# Patient Record
Sex: Male | Born: 1975 | Race: White | Hispanic: No | Smoking: Never smoker
Health system: Southern US, Community
[De-identification: ages and names within clinical notes are randomized; demographics above are authoritative.]

## PROBLEM LIST (undated history)

## (undated) ENCOUNTER — Emergency Department: Admission: EM | Payer: BC Managed Care – PPO

## (undated) DIAGNOSIS — J45909 Unspecified asthma, uncomplicated: Secondary | ICD-10-CM

## (undated) HISTORY — PX: KNEE ARTHROSCOPY W/ MENISCAL REPAIR: SHX1877

---

## 2007-05-12 ENCOUNTER — Ambulatory Visit: Payer: Self-pay | Admitting: Family Medicine

## 2007-06-03 ENCOUNTER — Emergency Department: Payer: Self-pay | Admitting: Unknown Physician Specialty

## 2008-07-15 ENCOUNTER — Ambulatory Visit: Payer: Self-pay | Admitting: Family Medicine

## 2011-02-08 ENCOUNTER — Ambulatory Visit: Payer: Self-pay | Admitting: Family Medicine

## 2012-11-06 ENCOUNTER — Ambulatory Visit: Payer: Self-pay | Admitting: Sports Medicine

## 2014-04-01 ENCOUNTER — Ambulatory Visit: Payer: Self-pay | Admitting: Family Medicine

## 2014-07-12 ENCOUNTER — Ambulatory Visit: Payer: Self-pay | Admitting: Unknown Physician Specialty

## 2014-07-23 DIAGNOSIS — S83242A Other tear of medial meniscus, current injury, left knee, initial encounter: Secondary | ICD-10-CM | POA: Insufficient documentation

## 2014-08-07 ENCOUNTER — Ambulatory Visit: Payer: Self-pay | Admitting: Unknown Physician Specialty

## 2014-09-02 ENCOUNTER — Ambulatory Visit: Payer: Self-pay

## 2014-09-02 LAB — CBC WITH DIFFERENTIAL/PLATELET
Basophil #: 0.1 10*3/uL (ref 0.0–0.1)
Basophil %: 1 %
EOS ABS: 0.1 10*3/uL (ref 0.0–0.7)
EOS PCT: 2.5 %
HCT: 44.5 % (ref 40.0–52.0)
HGB: 15.1 g/dL (ref 13.0–18.0)
Lymphocyte #: 2.1 10*3/uL (ref 1.0–3.6)
Lymphocyte %: 39.2 %
MCH: 28.8 pg (ref 26.0–34.0)
MCHC: 33.9 g/dL (ref 32.0–36.0)
MCV: 85 fL (ref 80–100)
MONO ABS: 0.5 x10 3/mm (ref 0.2–1.0)
Monocyte %: 8.8 %
Neutrophil #: 2.7 10*3/uL (ref 1.4–6.5)
Neutrophil %: 48.5 %
Platelet: 209 10*3/uL (ref 150–440)
RBC: 5.23 10*6/uL (ref 4.40–5.90)
RDW: 13.4 % (ref 11.5–14.5)
WBC: 5.5 10*3/uL (ref 3.8–10.6)

## 2014-09-02 LAB — CK: CK, TOTAL: 85 U/L (ref 39–308)

## 2014-09-02 LAB — COMPREHENSIVE METABOLIC PANEL
ALT: 34 U/L
ANION GAP: 9 (ref 7–16)
Albumin: 4.1 g/dL (ref 3.4–5.0)
Alkaline Phosphatase: 57 U/L
BUN: 13 mg/dL (ref 7–18)
Bilirubin,Total: 0.5 mg/dL (ref 0.2–1.0)
CHLORIDE: 102 mmol/L (ref 98–107)
Calcium, Total: 9.4 mg/dL (ref 8.5–10.1)
Co2: 30 mmol/L (ref 21–32)
Creatinine: 1.23 mg/dL (ref 0.60–1.30)
EGFR (African American): >60
Glucose: 97 mg/dL (ref 65–99)
OSMOLALITY: 281 (ref 275–301)
POTASSIUM: 4.7 mmol/L (ref 3.5–5.1)
SGOT(AST): 18 U/L (ref 15–37)
Sodium: 141 mmol/L (ref 136–145)
Total Protein: 7.3 g/dL (ref 6.4–8.2)

## 2014-09-02 LAB — HEMOGLOBIN A1C: Hemoglobin A1C: 5.7 % (ref 4.2–6.3)

## 2014-09-02 LAB — CK-MB: CK-MB: 0.8 ng/mL (ref 0.5–3.6)

## 2015-08-26 ENCOUNTER — Encounter: Payer: Self-pay | Admitting: Family Medicine

## 2015-08-26 ENCOUNTER — Ambulatory Visit (INDEPENDENT_AMBULATORY_CARE_PROVIDER_SITE_OTHER): Payer: BC Managed Care – PPO | Admitting: Family Medicine

## 2015-08-26 VITALS — BP 110/70 | HR 78 | Ht 67.0 in | Wt 190.0 lb

## 2015-08-26 DIAGNOSIS — G44219 Episodic tension-type headache, not intractable: Secondary | ICD-10-CM | POA: Diagnosis not present

## 2015-08-26 DIAGNOSIS — R55 Syncope and collapse: Secondary | ICD-10-CM | POA: Diagnosis not present

## 2015-08-26 NOTE — Progress Notes (Signed)
Name: Wesley Vaughn   MRN: [id number]    DOB: 10-08-1975   Date:08/26/2015       Progress Note  Subjective  Chief Complaint  Chief Complaint  Patient presents with  . Hypertension    last week had episodes of feeling like "I'm on the sideline in the wrestling matches, yelling, and a feeling of dizziness and lightheaded" Also having a dull headache that lasts approx a couple of minutes during the daytime. 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 The current episode started 1 to 4 weeks ago. The problem occurs intermittently. The problem has been unchanged. Associated symptoms include headaches, nausea and neck pain. Pertinent negatives include no abdominal pain, chest pain, chills, congestion, coughing, diaphoresis, fever, myalgias, numbness, rash, sore throat, swollen glands, vertigo, visual change or weakness. The symptoms are aggravated by exertion (excitment/vocalization). He has tried nothing for the symptoms. The treatment provided no relief.  Headache  This is a recurrent problem. The current episode started 1 to 4 weeks ago. The problem occurs intermittently. The problem has been waxing and waning. The pain is located in the left unilateral and frontal region. The pain does not radiate. The quality of the pain is described as aching. The pain is at a severity of 3/10. The pain is moderate. Associated symptoms include dizziness, nausea and neck pain. Pertinent negatives include no abdominal pain, abnormal behavior, back pain, blurred vision, coughing, ear pain, eye watering, fever, hearing loss, insomnia, loss of balance, muscle aches, numbness, phonophobia, photophobia, sore throat, swollen glands, tingling, tinnitus, visual change, weakness or weight loss. The symptoms are aggravated by emotional stress. He has tried acetaminophen for the symptoms. The treatment provided mild relief. There is no history of  cluster headaches, hypertension or recent head traumas.    No problem-specific assessment & plan notes found for this encounter.   No past medical history on file.  Past Surgical History  Procedure Laterality Date  . Knee arthroscopy w/ meniscal repair Left     No family history on file.  Social History   Social History  . Marital Status: Married    Spouse Name: N/A  . Number of Children: N/A  . Years of Education: N/A   Occupational History  . Not on file.   Social History Main Topics  . Smoking status: Never Smoker   . Smokeless tobacco: Current User    Types: Snuff  . Alcohol Use: 0.0 oz/week    0 Standard drinks or equivalent per week  . Drug Use: No  . Sexual Activity: Not on file   Other Topics Concern  . Not on file   Social History Narrative  . No narrative on file    No Known Allergies   Review of Systems  Constitutional: Negative for fever, chills, weight loss, malaise/fatigue and diaphoresis.  HENT: Negative for congestion, ear discharge, ear pain, hearing loss, sore throat and tinnitus.   Eyes: Negative for blurred vision and photophobia.  Respiratory: Negative for cough, sputum production, shortness of breath and wheezing.   Cardiovascular: Negative for chest pain, palpitations and leg swelling.  Gastrointestinal: Positive for nausea. Negative for heartburn, abdominal pain, diarrhea, constipation, blood in stool and melena.  Genitourinary: Negative for dysuria, urgency, frequency and hematuria.  Musculoskeletal: Positive for neck pain. Negative for myalgias, back pain and joint pain.  Skin: Negative for rash.  Neurological: Positive for dizziness and headaches. Negative for  vertigo, tingling, sensory change, focal weakness, weakness, numbness and loss of balance.  Endo/Heme/Allergies: Negative for environmental allergies and polydipsia. Does not bruise/bleed easily.  Psychiatric/Behavioral: Negative for depression and suicidal ideas. The patient  is not nervous/anxious and does not have insomnia.      Objective  Filed Vitals:   08/26/15 1604  BP: 110/70  Pulse: 78  Height: 5' 7"  (1.702 m)  Weight: 190 lb (86.183 kg)    Physical Exam  Constitutional: He is oriented to person, place, and time and well-developed, well-nourished, and in no distress.  HENT:  Head: Normocephalic.  Right Ear: External ear normal.  Left Ear: External ear normal.  Nose: Nose normal.  Mouth/Throat: Oropharynx is clear and moist.  Eyes: Conjunctivae and EOM are normal. Pupils are equal, round, and reactive to light. Right eye exhibits no discharge. Left eye exhibits no discharge. No scleral icterus.  Fundoscopic exam:      The right eye shows no hemorrhage and no papilledema.       The left eye shows no hemorrhage and no papilledema.  Neck: Normal range of motion. Neck supple. No JVD present. No tracheal deviation present. No thyromegaly present.  Cardiovascular: Normal rate, regular rhythm, normal heart sounds and intact distal pulses.  Exam reveals no gallop and no friction rub.   No murmur heard. Pulmonary/Chest: Breath sounds normal. No respiratory distress. He has no wheezes. He has no rales.  Abdominal: Soft. Bowel sounds are normal. He exhibits no mass. There is no hepatosplenomegaly. There is no tenderness. There is no rebound, no guarding and no CVA tenderness.  Musculoskeletal: Normal range of motion. He exhibits no edema or tenderness.  Lymphadenopathy:    He has no cervical adenopathy.  Neurological: He is alert and oriented to person, place, and time. He has normal sensation, normal strength, normal reflexes and intact cranial nerves. No cranial nerve deficit.  Skin: Skin is warm. No rash noted.  Psychiatric: Mood and affect normal.  Nursing note and vitals reviewed.     Assessment & Plan  Problem List Items Addressed This Visit    None    Visit Diagnoses    Near syncope    -  Primary    Relevant Orders    Ambulatory  referral to Cardiology    Episodic tension-type headache, not intractable             Dr. Otilio Miu [facility]  08/26/2015

## 2015-08-26 NOTE — Patient Instructions (Signed)
Near-Syncope Near-syncope (commonly known as near fainting) is sudden weakness, dizziness, or feeling like you might pass out. During an episode of near-syncope, you may also develop pale skin, have tunnel vision, or feel sick to your stomach (nauseous). Near-syncope may occur when getting up after sitting or while standing for a long time. It is caused by a sudden decrease in blood flow to the brain. This decrease can result from various causes or triggers, most of which are not serious. However, because near-syncope can sometimes be a sign of something serious, a medical evaluation is required. The specific cause is often not determined. HOME CARE INSTRUCTIONS  Monitor your condition for any changes. The following actions may help to alleviate any discomfort you are experiencing:  Have someone stay with you until you feel stable.  Lie down right away and prop your feet up if you start feeling like you might faint. Breathe deeply and steadily. Wait until all the symptoms have passed. Most of these episodes last only a few minutes. You may feel tired for several hours.   Drink enough fluids to keep your urine clear or pale yellow.   If you are taking blood pressure or heart medicine, get up slowly when seated or lying down. Take several minutes to sit and then stand. This can reduce dizziness.  Follow up with your health care provider as directed. SEEK IMMEDIATE MEDICAL CARE IF:   You have a severe headache.   You have unusual pain in the chest, abdomen, or back.   You are bleeding from the mouth or rectum, or you have black or tarry stool.   You have an irregular or very fast heartbeat.   You have repeated fainting or have seizure-like jerking during an episode.   You faint when sitting or lying down.   You have confusion.   You have difficulty walking.   You have severe weakness.   You have vision problems.  MAKE SURE YOU:   Understand these instructions.  Will  watch your condition.  Will get help right away if you are not doing well or get worse.   This information is not intended to replace advice given to you by your health care provider. Make sure you discuss any questions you have with your health care provider.   Document Released: 09/06/2005 Document Revised: 09/11/2013 Document Reviewed: 02/09/2013 Elsevier Interactive Patient Education [employer].

## 2015-08-27 ENCOUNTER — Encounter: Payer: Self-pay | Admitting: Family Medicine

## 2015-10-19 ENCOUNTER — Encounter: Payer: Self-pay | Admitting: *Deleted

## 2015-10-19 ENCOUNTER — Emergency Department
Admission: EM | Admit: 2015-10-19 | Discharge: 2015-10-20 | Disposition: A | Discharge status: Home / Self Care | Payer: BC Managed Care – PPO | Attending: Emergency Medicine | Admitting: Emergency Medicine

## 2015-10-19 ENCOUNTER — Emergency Department: Payer: BC Managed Care – PPO

## 2015-10-19 DIAGNOSIS — M791 Myalgia, unspecified site: Secondary | ICD-10-CM

## 2015-10-19 DIAGNOSIS — R079 Chest pain, unspecified: Secondary | ICD-10-CM | POA: Diagnosis not present

## 2015-10-19 DIAGNOSIS — B349 Viral infection, unspecified: Secondary | ICD-10-CM

## 2015-10-19 HISTORY — DX: Unspecified asthma, uncomplicated: J45.909

## 2015-10-19 LAB — BASIC METABOLIC PANEL
ANION GAP: 9 (ref 5–15)
BUN: 17 mg/dL (ref 6–20)
CHLORIDE: 105 mmol/L (ref 101–111)
CO2: 26 mmol/L (ref 22–32)
Calcium: 9.8 mg/dL (ref 8.9–10.3)
Creatinine, Ser: 1.09 mg/dL (ref 0.61–1.24)
GFR calc non Af Amer: >60 mL/min (ref >60)
Glucose, Bld: 105 mg/dL — ABNORMAL HIGH (ref 65–99)
Potassium: 3.7 mmol/L (ref 3.5–5.1)
Sodium: 140 mmol/L (ref 135–145)

## 2015-10-19 LAB — CBC
HCT: 43.5 % (ref 40.0–52.0)
Hemoglobin: 14.9 g/dL (ref 13.0–18.0)
MCH: 28.6 pg (ref 26.0–34.0)
MCHC: 34.4 g/dL (ref 32.0–36.0)
MCV: 83.3 fL (ref 80.0–100.0)
Platelets: 203 10*3/uL (ref 150–440)
RBC: 5.22 MIL/uL (ref 4.40–5.90)
RDW: 13 % (ref 11.5–14.5)
WBC: 10.2 10*3/uL (ref 3.8–10.6)

## 2015-10-19 LAB — TROPONIN I: Troponin I: <0.03 ng/mL (ref <0.031)

## 2015-10-19 NOTE — ED Provider Notes (Signed)
[facility] Emergency Department Provider Note  ____________________________________________  Time seen: Approximately 11:57 PM  I have reviewed the triage vital signs and the nursing notes.   HISTORY  Chief Complaint Chest Pain    HPI Wesley Vaughn is a 40 y.o. male who presents to ED from home with a chief complaint of fever, myalgias, chills, chest pain. 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 They were at a tournament yesterday and he states he began to not feel well with low-grade fever, chills,aching pain to the left side of his chest which radiates into his left scapula. + sick contacts. Pain is worsened on deep inspiration and movement of his torso. Denies recent travel or trauma. Denies associated headache, neck pain, diaphoresis, palpitations, shortness of breath, nausea, vomiting, diarrhea.   Past Medical History  Diagnosis Date  . Asthma with allergic rhinitis     There are no active problems to display for this patient.   Past Surgical History  Procedure Laterality Date  . Knee arthroscopy w/ meniscal repair Left     Current Outpatient Rx  Name  Route  Sig  Dispense  Refill  . loratadine (CLARITIN) 10 MG tablet   Oral   Take 10 mg by mouth daily as needed for allergies.            Allergies Review of patient's allergies indicates no known allergies.  Family history CAD  Social History Social History  Substance Use Topics  . Smoking status: Never Smoker   . Smokeless tobacco: Current User    Types: Snuff  . Alcohol Use: 0.0 oz/week    0 Standard drinks or equivalent per week    Review of Systems Constitutional: Positive for fever/chills. Positive for myalgias. Eyes: No visual changes. ENT: Positive for sore throat. Cardiovascular: Positive for chest pain. Respiratory: Denies shortness of breath. Gastrointestinal: No abdominal pain.  No nausea, no vomiting.  No  diarrhea.  No constipation. Genitourinary: Negative for dysuria. Musculoskeletal: Negative for back pain. Skin: Negative for rash. Neurological: Negative for headaches, focal weakness or numbness.  10-point ROS otherwise negative.  ____________________________________________   PHYSICAL EXAM:  VITAL SIGNS: ED Triage Vitals  Enc Vitals Group     BP 10/19/15 2144 147/84 mmHg     Pulse Rate 10/19/15 2144 91     Resp 10/19/15 2144 20     Temp 10/19/15 2144 99.4 F (37.4 C)     Temp Source 10/19/15 2144 Oral     SpO2 10/19/15 2144 99 %     Weight 10/19/15 2144 185 lb (83.915 kg)     Height 10/19/15 2144 5' 8"  (1.727 m)     Head Cir --      Peak Flow --      Pain Score 10/19/15 2145 5     Pain Loc --      Pain Edu? --      Excl. in [place]? --     Constitutional: Alert and oriented. Well appearing and in no acute distress. Eyes: Conjunctivae are normal. PERRL. EOMI. Head: Atraumatic. Nose: No congestion/rhinnorhea. Mouth/Throat: Mucous membranes are moist.  Oropharynx non-erythematous. Neck: No stridor.  Supple neck without evidence for meningismus. No carotid bruits.  Cardiovascular: Normal rate, regular rhythm. Grossly normal heart sounds.  Good peripheral circulation.  No murmurs, gallops or rubs. Respiratory: Normal respiratory effort.  No retractions. Lungs CTAB. Gastrointestinal: Soft and nontender. No distention. No abdominal bruits. No CVA tenderness.  Musculoskeletal: No lower extremity tenderness nor edema.  No joint effusions. Neurologic:  Normal speech and language. No gross focal neurologic deficits are appreciated. No gait instability. Skin:  Skin is warm, dry and intact. No rash noted. Specifically, no petechiae. Psychiatric: Mood and affect are normal. Speech and behavior are normal.  ____________________________________________   LABS (all labs ordered are listed, but only abnormal results are displayed)  Labs Reviewed  BASIC METABOLIC PANEL - Abnormal;  Notable for the following:    Glucose, Bld 105 (*)    All other components within normal limits  CBC  TROPONIN I  FIBRIN DERIVATIVES D-DIMER ([facility] ONLY)  TROPONIN I  CK   ____________________________________________  EKG  ED ECG REPORT I, SUNG,JADE J, the attending physician, personally viewed and interpreted this ECG.   Date: 10/20/2015  EKG Time: 2143  Rate: 87  Rhythm: normal EKG, normal sinus rhythm  Axis: Normal  Intervals:right bundle branch block  ST&T Change: Nonspecific  ____________________________________________  RADIOLOGY  Chest 2 view (viewed by me, interpreted per Dr. Pascal Lux): Negative chest. ____________________________________________   PROCEDURES  Procedure(s) performed: None  Critical Care performed: No  ____________________________________________   INITIAL IMPRESSION / ASSESSMENT AND PLAN / ED COURSE  Pertinent labs & imaging results that were available during my care of the patient were reviewed by me and considered in my medical decision making (see chart for details).  40 year old male who presents with low-grade fever, myalgias, chest pain. Initial EKG and laboratory results unremarkable. Will add CK, d-dimer and repeat troponin. IV fluids and Toradol given for myalgias.  ----------------------------------------- 2:41 AM on 10/20/2015 -----------------------------------------  Patient feeling better. Updated patient and his mother of negative CK, d-dimer and repeat troponin. Most likely inflammation secondary to viral process causing his costochondritis. No evidence for myocarditis. Plan for NSAIDs, analgesia as needed and close follow-up with his PCP. Strict return given. Both verbalize understanding and agree with plan of care. ____________________________________________   FINAL CLINICAL IMPRESSION(S) / ED DIAGNOSES  Final diagnoses:  Chest pain, unspecified chest pain type  Myalgia  Viral syndrome      Paulette Blanch,  MD 10/20/15 [phone]

## 2015-10-19 NOTE — ED Notes (Signed)
Lab called about added on trop

## 2015-10-19 NOTE — ED Notes (Signed)
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 Pt also states that he son said that he didn't feel well today, pt denies cough, pt states that when he leans forward he has some chest pain as well.

## 2015-10-19 NOTE — ED Notes (Signed)
Pt presents w/ c/o chest pain, reproducible w/ inspiration and bending forward. Pt also c/o slight fever at home for which he has taken tylenol. Pt has an abscess to back of head.

## 2015-10-20 LAB — CK: Total CK: 127 U/L (ref 49–397)

## 2015-10-20 LAB — FIBRIN DERIVATIVES D-DIMER (ARMC ONLY): FIBRIN DERIVATIVES D-DIMER (ARMC): 251 (ref 0–499)

## 2015-10-20 LAB — TROPONIN I

## 2015-10-20 MED ORDER — KETOROLAC TROMETHAMINE 30 MG/ML IJ SOLN
10.0000 mg | Freq: Once | INTRAMUSCULAR | Status: AC
  Administered 2015-10-20: 9.9 mg via INTRAVENOUS
  Filled 2015-10-20: qty 1

## 2015-10-20 MED ORDER — ONDANSETRON 4 MG PO TBDP: 4.0000 mg | ORAL_TABLET | Freq: Three times a day (TID) | ORAL | Status: DC | PRN

## 2015-10-20 MED ORDER — ONDANSETRON 4 MG PO TBDP
4.0000 mg | ORAL_TABLET | Freq: Once | ORAL | Status: AC
  Administered 2015-10-20: 4 mg via ORAL
  Filled 2015-10-20: qty 1

## 2015-10-20 MED ORDER — HYDROCODONE-ACETAMINOPHEN 5-325 MG PO TABS: 1.0000 | ORAL_TABLET | Freq: Four times a day (QID) | ORAL | Status: DC | PRN

## 2015-10-20 MED ORDER — HYDROCODONE-ACETAMINOPHEN 5-325 MG PO TABS
1.0000 | ORAL_TABLET | Freq: Once | ORAL | Status: AC
  Administered 2015-10-20: 1 via ORAL
  Filled 2015-10-20: qty 1

## 2015-10-20 MED ORDER — SODIUM CHLORIDE 0.9 % IV BOLUS (SEPSIS)
1000.0000 mL | Freq: Once | INTRAVENOUS | Status: AC
  Administered 2015-10-20: 1000 mL via INTRAVENOUS

## 2015-10-20 MED ORDER — IBUPROFEN 800 MG PO TABS: 800.0000 mg | ORAL_TABLET | Freq: Three times a day (TID) | ORAL | Status: DC | PRN

## 2015-10-20 NOTE — Discharge Instructions (Signed)
1. You may take pain medicines as needed (Motrin/Norco #15). 2. Apply moist heat to affected area several times daily. 3. Return to the ER for worsening symptoms, persistent vomiting, difficulty breathing or other concerns.  Nonspecific Chest Pain It is often hard to find the cause of chest pain. There is always a chance that your pain could be related to something serious, such as a heart attack or a blood clot in your lungs. Chest pain can also be caused by conditions that are not life-threatening. If you have chest pain, it is very important to follow up with your doctor.  HOME CARE  If you were prescribed an antibiotic medicine, finish it all even if you start to feel better.  Avoid any activities that cause chest pain.  Do not use any tobacco products, including cigarettes, chewing tobacco, or electronic cigarettes. If you need help quitting, ask your doctor.  Do not drink alcohol.  Take medicines only as told by your doctor.  Keep all follow-up visits as told by your doctor. This is important. This includes any further testing if your chest pain does not go away.  Your doctor may tell you to keep your head raised (elevated) while you sleep.  Make lifestyle changes as told by your doctor. These may include:  Getting regular exercise. Ask your doctor to suggest some activities that are safe for you.  Eating a heart-healthy diet. Your doctor or a diet specialist (dietitian) can help you to learn healthy eating options.  Maintaining a healthy weight.  Managing diabetes, if necessary.  Reducing stress. GET HELP IF:  Your chest pain does not go away, even after treatment.  You have a rash with blisters on your chest.  You have a fever. GET HELP RIGHT AWAY IF:  Your chest pain is worse.  You have an increasing cough, or you cough up blood.  You have severe belly (abdominal) pain.  You feel extremely weak.  You pass out (faint).  You have chills.  You have sudden,  unexplained chest discomfort.  You have sudden, unexplained discomfort in your arms, back, neck, or jaw.  You have shortness of breath at any time.  You suddenly start to sweat, or your skin gets clammy.  You feel nauseous.  You vomit.  You suddenly feel light-headed or dizzy.  Your heart begins to beat quickly, or it feels like it is skipping beats. These symptoms may be an emergency. Do not wait to see if the symptoms will go away. Get medical help right away. Call your local emergency services (911 in the [place] Do not drive yourself to the hospital.   This information is not intended to replace advice given to you by your health care provider. Make sure you discuss any questions you have with your health care provider.   Document Released: 02/23/2008 Document Revised: 09/27/2014 Document Reviewed: 04/12/2014 Elsevier Interactive Patient Education 2016 Else[employer] Inc[employer]  Chest Wall Pain Chest wall pain is pain in or around the bones and muscles of your chest. Sometimes, an injury causes this pain. Sometimes, the cause may not be known. This pain may take several weeks or longer to get better. HOME CARE Pay attention to any changes in your symptoms. Take these actions to help with your pain:  Rest as told by your doctor.  Avoid activities that cause pain. Try not to use your chest, belly (abdominal), or side muscles to lift heavy things.  If directed, apply ice to the painful area:  Put ice  in a plastic bag.  Place a towel between your skin and the bag.  Leave the ice on for 20 minutes, 2-3 times per day.  Take over-the-counter and prescription medicines only as told by your doctor.  Do not use tobacco products, including cigarettes, chewing tobacco, and e-cigarettes. If you need help quitting, ask your doctor.  Keep all follow-up visits as told by your doctor. This is important. GET HELP IF:  You have a fever.  Your chest pain gets worse.  You have new symptoms. GET  HELP RIGHT AWAY IF:  You feel sick to your stomach (nauseous) or you throw up (vomit).  You feel sweaty or light-headed.  You have a cough with phlegm (sputum) or you cough up blood.  You are short of breath.   This information is not intended to replace advice given to you by your health care provider. Make sure you discuss any questions you have with your health care provider.   Document Released: 02/23/2008 Document Revised: 05/28/2015 Document Reviewed: 12/02/2014 Elsevier Interactive Patient Education 2016 Else[employer] Inc[employer]  Muscle Pain, Adult Muscle pain (myalgia) may be caused by many things, including:  Overuse or muscle strain, especially if you are not in shape. This is the most common cause of muscle pain.  Injury.  Bruises.  Viruses, such as the flu.  Infectious diseases.  Fibromyalgia, which is a chronic condition that causes muscle tenderness, fatigue, and headache.  Autoimmune diseases, including lupus.  Certain drugs, including ACE inhibitors and statins. Muscle pain may be mild or severe. In most cases, the pain lasts only a short time and goes away without treatment. To diagnose the cause of your muscle pain, your health care provider will take your medical history. This means he or she will ask you when your muscle pain began and what has been happening. If you have not had muscle pain for very long, your health care provider may want to wait before doing much testing. If your muscle pain has lasted a long time, your health care provider may want to run tests right away. If your health care provider thinks your muscle pain may be caused by illness, you may need to have additional tests to rule out certain conditions.  Treatment for muscle pain depends on the cause. Home care is often enough to relieve muscle pain. Your health care provider may also prescribe anti-inflammatory medicine. HOME CARE INSTRUCTIONS Watch your condition for any changes. The following  actions may help to lessen any discomfort you are feeling:  Only take over-the-counter or prescription medicines as directed by your health care provider.  Apply ice to the sore muscle:  Put ice in a plastic bag.  Place a towel between your skin and the bag.  Leave the ice on for 15-20 minutes, 3-4 times a day.  You may alternate applying hot and cold packs to the muscle as directed by your health care provider.  If overuse is causing your muscle pain, slow down your activities until the pain goes away.  Remember that it is normal to feel some muscle pain after starting a workout program. Muscles that have not been used often will be sore at first.  Do regular, gentle exercises if you are not usually active.  Warm up before exercising to lower your risk of muscle pain.  Do not continue working out if the pain is very bad. Bad pain could mean you have injured a muscle. SEEK MEDICAL CARE IF:  Your muscle pain gets worse,  and medicines do not help.  You have muscle pain that lasts longer than 3 days.  You have a rash or fever along with muscle pain.  You have muscle pain after a tick bite.  You have muscle pain while working out, even though you are in good physical condition.  You have redness, soreness, or swelling along with muscle pain.  You have muscle pain after starting a new medicine or changing the dose of a medicine. SEEK IMMEDIATE MEDICAL CARE IF:  You have trouble breathing.  You have trouble swallowing.  You have muscle pain along with a stiff neck, fever, and vomiting.  You have severe muscle weakness or cannot move part of your body. MAKE SURE YOU:   Understand these instructions.  Will watch your condition.  Will get help right away if you are not doing well or get worse.   This information is not intended to replace advice given to you by your health care provider. Make sure you discuss any questions you have with your health care provider.     Document Released: 07/29/2006 Document Revised: 09/27/2014 Document Reviewed: 07/03/2013 Elsevier Interactive Patient Education [employer].

## 2015-10-21 ENCOUNTER — Ambulatory Visit (INDEPENDENT_AMBULATORY_CARE_PROVIDER_SITE_OTHER): Payer: BC Managed Care – PPO | Admitting: Family Medicine

## 2015-10-21 ENCOUNTER — Ambulatory Visit
Admission: RE | Admit: 2015-10-21 | Discharge: 2015-10-21 | Disposition: A | Discharge status: Home / Self Care | Payer: BC Managed Care – PPO | Source: Ambulatory Visit | Attending: Family Medicine | Admitting: Family Medicine

## 2015-10-21 ENCOUNTER — Encounter: Payer: Self-pay | Admitting: Family Medicine

## 2015-10-21 VITALS — BP 124/100 | HR 70 | Ht 68.0 in | Wt 186.0 lb

## 2015-10-21 DIAGNOSIS — R079 Chest pain, unspecified: Secondary | ICD-10-CM

## 2015-10-21 DIAGNOSIS — J189 Pneumonia, unspecified organism: Secondary | ICD-10-CM | POA: Insufficient documentation

## 2015-10-21 DIAGNOSIS — J90 Pleural effusion, not elsewhere classified: Secondary | ICD-10-CM | POA: Diagnosis not present

## 2015-10-21 NOTE — Patient Instructions (Signed)
Pleurisy  Pleurisy is an inflammation and swelling of the lining of the lungs (pleura). Because of this inflammation, it hurts to breathe. It can be aggravated by coughing, laughing, or deep breathing. Pleurisy is often caused by an underlying infection or disease.   HOME CARE INSTRUCTIONS   Monitor your pleurisy for any changes. The following actions may help to alleviate any discomfort you are experiencing:  · Medicine may help with pain. Only take over-the-counter or prescription medicines for pain, discomfort, or fever as directed by your health care provider.  · Only take antibiotic medicine as directed. Make sure to finish it even if you start to feel better.  SEEK MEDICAL CARE IF:   · Your pain is not controlled with medicine or is increasing.  · You have an increase in pus-like (purulent) secretions brought up with coughing.  SEEK IMMEDIATE MEDICAL CARE IF:   · You have blue or dark lips, fingernails, or toenails.  · You are coughing up blood.  · You have increased difficulty breathing.  · You have continuing pain unrelieved by medicine or pain lasting more than 1 week.  · You have pain that radiates into your neck, arms, or jaw.  · You develop increased shortness of breath or wheezing.  · You develop a fever, rash, vomiting, fainting, or other serious symptoms.  MAKE SURE YOU:  · Understand these instructions.    · Will watch your condition.    · Will get help right away if you are not doing well or get worse.        This information is not intended to replace advice given to you by your health care provider. Make sure you discuss any questions you have with your health care provider.     Document Released: 09/06/2005 Document Revised: 05/09/2013 Document Reviewed: 02/18/2013  Elsevier Interactive Patient Education ©2016 [employer]

## 2015-10-21 NOTE — Progress Notes (Signed)
Name: Wesley Vaughn   MRN: [id number]    DOB: [birth year]   Date:10/21/2015       Progress Note  Subjective  Chief Complaint  Chief Complaint  Patient presents with  . Follow-up    ER visit on 10/19/2015    Chest Pain  This is a new problem. The current episode started in the past 7 days. The onset quality is sudden. The problem occurs constantly. The problem has been waxing and waning. The pain is present in the lateral region and substernal region. The pain is at a severity of 8/10. The pain is moderate. The quality of the pain is described as burning and tightness. The pain radiates to the mid back. Associated symptoms include a fever and shortness of breath. Pertinent negatives include no abdominal pain, back pain, cough, diaphoresis, dizziness, headaches, irregular heartbeat, lower extremity edema, malaise/fatigue, nausea, palpitations or sputum production. The pain is aggravated by deep breathing and coughing. He has tried acetaminophen, analgesics and NSAIDs for the symptoms. The treatment provided mild (ibuprofen) relief. Prior diagnostic workup includes chest x-ray, echocardiogram and exercise treadmill test (echo/stress 2009).    No problem-specific assessment & plan notes found for this encounter.   Past Medical History  Diagnosis Date  . Asthma with allergic rhinitis     Past Surgical History  Procedure Laterality Date  . Knee arthroscopy w/ meniscal repair Left     Family History  Problem Relation Age of Onset  . Hyperlipidemia Mother   . Diabetes Mother   . Diabetes Father   . Hyperlipidemia Father   . Depression Father   . Stroke Father   . Hypertension Father     Social History   Social History  . Marital Status: Married    Spouse Name: N/A  . Number of Children: N/A  . Years of Education: N/A   Occupational History  . Not on file.   Social History Main Topics  . Smoking status: Never Smoker   . Smokeless tobacco: Current User    Types:  Snuff  . Alcohol Use: 0.0 oz/week    0 Standard drinks or equivalent per week  . Drug Use: No  . Sexual Activity: Yes   Other Topics Concern  . Not on file   Social History Narrative    No Known Allergies   Review of Systems  Constitutional: Positive for fever. Negative for chills, weight loss, malaise/fatigue and diaphoresis.  HENT: Negative for ear discharge, ear pain and sore throat.   Eyes: Negative for blurred vision.  Respiratory: Positive for shortness of breath. Negative for cough, sputum production and wheezing.   Cardiovascular: Positive for chest pain. Negative for palpitations and leg swelling.  Gastrointestinal: Negative for heartburn, nausea, abdominal pain, diarrhea, constipation, blood in stool and melena.  Genitourinary: Negative for dysuria, urgency, frequency and hematuria.  Musculoskeletal: Negative for myalgias, back pain, joint pain and neck pain.  Skin: Negative for rash.  Neurological: Negative for dizziness, tingling, sensory change, focal weakness and headaches.  Endo/Heme/Allergies: Negative for environmental allergies and polydipsia. Does not bruise/bleed easily.  Psychiatric/Behavioral: Negative for depression and suicidal ideas. The patient is not nervous/anxious and does not have insomnia.      Objective  Filed Vitals:   10/21/15 1427  BP: 124/100  Pulse: 70  Height: 5' 8"  (1.727 m)  Weight: 186 lb (84.369 kg)    Physical Exam  Constitutional: He is oriented to person, place, and time and well-developed, well-nourished, and in no distress.  HENT:  Head: Normocephalic.  Right Ear: External ear normal.  Left Ear: External ear normal.  Nose: Nose normal.  Mouth/Throat: Oropharynx is clear and moist.  Eyes: Conjunctivae and EOM are normal. Pupils are equal, round, and reactive to light. Right eye exhibits no discharge. Left eye exhibits no discharge. No scleral icterus.  Neck: Normal range of motion. Neck supple. No JVD present. No tracheal  deviation present. No thyromegaly present.  Cardiovascular: Normal rate, regular rhythm, normal heart sounds and intact distal pulses.  Exam reveals no gallop and no friction rub.   No murmur heard. Pulmonary/Chest: Breath sounds normal. No respiratory distress. He has no wheezes. He has no rales.  Abdominal: Soft. Bowel sounds are normal. He exhibits no mass. There is no hepatosplenomegaly. There is no tenderness. There is no rebound, no guarding and no CVA tenderness.  Musculoskeletal: Normal range of motion. He exhibits no edema or tenderness.  Lymphadenopathy:    He has no cervical adenopathy.  Neurological: He is alert and oriented to person, place, and time. He has normal sensation, normal strength, normal reflexes and intact cranial nerves. No cranial nerve deficit.  Skin: Skin is warm. No rash noted.  Psychiatric: Mood and affect normal.  Nursing note and vitals reviewed.     Assessment & Plan  Problem List Items Addressed This Visit    None    Visit Diagnoses    Chest pain, unspecified chest pain type    -  Primary    chest wall vs pleurisy vs intercostal neuritis    Relevant Orders    EKG 12-Lead (Completed)    Ambulatory referral to Cardiology    DG Chest 2 View         Dr. Otilio Miu [facility]  10/21/2015

## 2015-10-22 ENCOUNTER — Other Ambulatory Visit: Payer: Self-pay

## 2015-10-22 DIAGNOSIS — J189 Pneumonia, unspecified organism: Secondary | ICD-10-CM

## 2015-10-22 DIAGNOSIS — R42 Dizziness and giddiness: Secondary | ICD-10-CM | POA: Insufficient documentation

## 2015-10-22 DIAGNOSIS — J181 Lobar pneumonia, unspecified organism: Principal | ICD-10-CM

## 2015-10-22 DIAGNOSIS — R079 Chest pain, unspecified: Secondary | ICD-10-CM | POA: Insufficient documentation

## 2015-10-22 MED ORDER — LEVOFLOXACIN 500 MG PO TABS: 500.0000 mg | ORAL_TABLET | Freq: Every day | ORAL | Status: DC

## 2015-10-28 ENCOUNTER — Encounter: Payer: Self-pay | Admitting: Family Medicine

## 2015-10-28 ENCOUNTER — Ambulatory Visit (INDEPENDENT_AMBULATORY_CARE_PROVIDER_SITE_OTHER): Payer: BC Managed Care – PPO | Admitting: Family Medicine

## 2015-10-28 VITALS — BP 110/62 | HR 62 | Temp 98.5°F | Ht 68.0 in | Wt 185.0 lb

## 2015-10-28 DIAGNOSIS — J153 Pneumonia due to streptococcus, group B: Secondary | ICD-10-CM | POA: Diagnosis not present

## 2015-10-28 NOTE — Progress Notes (Signed)
Name: Wesley Vaughn   MRN: [id number]    DOB: [birth year]   Date:10/28/2015       Progress Note  Subjective  Chief Complaint  Chief Complaint  Patient presents with  . Follow-up    pneumonia- took last Levaquin today    Cough This is a new problem. The current episode started 1 to 4 weeks ago. The problem has been gradually improving. The cough is productive of purulent sputum. Associated symptoms include chest pain, chills, a fever, myalgias, nasal congestion and shortness of breath. Pertinent negatives include no ear congestion, ear pain, headaches, heartburn, hemoptysis, postnasal drip, rash, rhinorrhea, sore throat, weight loss or wheezing. Treatments tried: antibiotic. The treatment provided moderate relief. There is no history of asthma, bronchiectasis, bronchitis, COPD, emphysema, environmental allergies or pneumonia.    No problem-specific assessment & plan notes found for this encounter.   Past Medical History  Diagnosis Date  . Asthma with allergic rhinitis     Past Surgical History  Procedure Laterality Date  . Knee arthroscopy w/ meniscal repair Left     Family History  Problem Relation Age of Onset  . Hyperlipidemia Mother   . Diabetes Mother   . Diabetes Father   . Hyperlipidemia Father   . Depression Father   . Stroke Father   . Hypertension Father     Social History   Social History  . Marital Status: Married    Spouse Name: N/A  . Number of Children: N/A  . Years of Education: N/A   Occupational History  . Not on file.   Social History Main Topics  . Smoking status: Never Smoker   . Smokeless tobacco: Current User    Types: Snuff  . Alcohol Use: 0.0 oz/week    0 Standard drinks or equivalent per week  . Drug Use: No  . Sexual Activity: Yes   Other Topics Concern  . Not on file   Social History Narrative    No Known Allergies   Review of Systems  Constitutional: Positive for fever and chills. Negative for weight loss and  malaise/fatigue.  HENT: Negative for ear discharge, ear pain, postnasal drip, rhinorrhea and sore throat.   Eyes: Negative for blurred vision.  Respiratory: Positive for cough and shortness of breath. Negative for hemoptysis, sputum production and wheezing.   Cardiovascular: Positive for chest pain. Negative for palpitations and leg swelling.  Gastrointestinal: Negative for heartburn, nausea, abdominal pain, diarrhea, constipation, blood in stool and melena.  Genitourinary: Negative for dysuria, urgency, frequency and hematuria.  Musculoskeletal: Positive for myalgias. Negative for back pain, joint pain and neck pain.  Skin: Negative for rash.  Neurological: Negative for dizziness, tingling, sensory change, focal weakness and headaches.  Endo/Heme/Allergies: Negative for environmental allergies and polydipsia. Does not bruise/bleed easily.  Psychiatric/Behavioral: Negative for depression and suicidal ideas. The patient is not nervous/anxious and does not have insomnia.      Objective  Filed Vitals:   10/28/15 1546  BP: 110/62  Pulse: 62  Temp: 98.5 F (36.9 C)  TempSrc: Oral  Height: 5' 8"  (1.727 m)  Weight: 185 lb (83.915 kg)    Physical Exam  Constitutional: He is oriented to person, place, and time and well-developed, well-nourished, and in no distress.  HENT:  Head: Normocephalic.  Right Ear: External ear normal.  Left Ear: External ear normal.  Nose: Nose normal.  Mouth/Throat: Oropharynx is clear and moist.  Eyes: Conjunctivae and EOM are normal. Pupils are equal, round, and reactive to light.  Right eye exhibits no discharge. Left eye exhibits no discharge. No scleral icterus.  Neck: Normal range of motion. Neck supple. No JVD present. No tracheal deviation present. No thyromegaly present.  Cardiovascular: Normal rate, regular rhythm, normal heart sounds and intact distal pulses.  Exam reveals no gallop and no friction rub.   No murmur heard. Pulmonary/Chest: Breath  sounds normal. No respiratory distress. He has no wheezes. He has no rales.  Abdominal: Soft. Bowel sounds are normal. He exhibits no mass. There is no hepatosplenomegaly. There is no tenderness. There is no rebound, no guarding and no CVA tenderness.  Musculoskeletal: Normal range of motion. He exhibits no edema or tenderness.  Lymphadenopathy:    He has no cervical adenopathy.  Neurological: He is alert and oriented to person, place, and time. He has normal sensation, normal strength and intact cranial nerves. No cranial nerve deficit.  Skin: Skin is warm. No rash noted.  Psychiatric: Mood and affect normal.      Assessment & Plan  Problem List Items Addressed This Visit    None    Visit Diagnoses    Pneumonia due to group B Streptococcus [facility])    -  Primary         Dr. Otilio Miu [facility] [place]  10/28/2015

## 2015-11-03 ENCOUNTER — Ambulatory Visit
Admission: RE | Admit: 2015-11-03 | Discharge: 2015-11-03 | Disposition: A | Discharge status: Home / Self Care | Payer: BC Managed Care – PPO | Source: Ambulatory Visit | Attending: Family Medicine | Admitting: Family Medicine

## 2015-11-03 ENCOUNTER — Encounter: Payer: Self-pay | Admitting: Family Medicine

## 2015-11-03 ENCOUNTER — Ambulatory Visit (INDEPENDENT_AMBULATORY_CARE_PROVIDER_SITE_OTHER): Payer: BC Managed Care – PPO | Admitting: Family Medicine

## 2015-11-03 VITALS — BP 120/80 | HR 64 | Temp 98.5°F | Ht 68.0 in | Wt 180.0 lb

## 2015-11-03 DIAGNOSIS — J918 Pleural effusion in other conditions classified elsewhere: Secondary | ICD-10-CM | POA: Insufficient documentation

## 2015-11-03 DIAGNOSIS — J181 Lobar pneumonia, unspecified organism: Principal | ICD-10-CM

## 2015-11-03 DIAGNOSIS — L508 Other urticaria: Secondary | ICD-10-CM

## 2015-11-03 DIAGNOSIS — J189 Pneumonia, unspecified organism: Secondary | ICD-10-CM | POA: Diagnosis not present

## 2015-11-03 DIAGNOSIS — L519 Erythema multiforme, unspecified: Secondary | ICD-10-CM

## 2015-11-03 DIAGNOSIS — L509 Urticaria, unspecified: Secondary | ICD-10-CM

## 2015-11-03 LAB — CBC WITH DIFFERENTIAL/PLATELET
BASOS ABS: 0.1 10*3/uL (ref 0–0.1)
Basophils Relative: 1 %
Eosinophils Absolute: 0.1 10*3/uL (ref 0–0.7)
Eosinophils Relative: 1 %
HEMATOCRIT: 43.2 % (ref 40.0–52.0)
HEMOGLOBIN: 14.7 g/dL (ref 13.0–18.0)
LYMPHS ABS: 2 10*3/uL (ref 1.0–3.6)
LYMPHS PCT: 13 %
MCH: 28.2 pg (ref 26.0–34.0)
MCHC: 33.9 g/dL (ref 32.0–36.0)
MCV: 83 fL (ref 80.0–100.0)
Monocytes Absolute: 1.1 10*3/uL — ABNORMAL HIGH (ref 0.2–1.0)
Monocytes Relative: 7 %
NEUTROS ABS: 12.1 10*3/uL — AB (ref 1.4–6.5)
Neutrophils Relative %: 78 %
Platelets: 590 10*3/uL — ABNORMAL HIGH (ref 150–440)
RBC: 5.21 MIL/uL (ref 4.40–5.90)
RDW: 13.1 % (ref 11.5–14.5)
WBC: 15.3 10*3/uL — AB (ref 3.8–10.6)

## 2015-11-03 MED ORDER — HYDROXYZINE HCL 10 MG PO TABS: 10.0000 mg | ORAL_TABLET | Freq: Three times a day (TID) | ORAL | Status: DC | PRN

## 2015-11-03 NOTE — Patient Instructions (Signed)
Erythema Multiforme Erythema multiforme is a rash that usually occurs on the skin, but can also occur on the lips and on the inside of the mouth. It is usually a mild condition that goes away on its own. It most often affects young adults and children. The rash shows up suddenly and often lasts 1-4 weeks. In some cases, the rash may come back again after clearing up. CAUSES  The cause of erythema multiforme may be an overreaction by the body's immune system to a trigger.  Common triggers include:   Infection, most commonly by the cold sore virus (human herpes virus, HSV), bacteria, or fungus. Less common triggers include:   Medicines.   Other illnesses.  In some cases, the cause may not be known.  SIGNS AND SYMPTOMS  The rash from erythema multiforme shows up suddenly. It may appear days after exposure to the trigger. It may start as small, red, round or oval marks that become bumps or raised welts over 24-48 hours. These bumps may resemble a target or a "bull's eye." These can spread and be quite large (about 1 inch [2.5 cm]). There may be mild itching or burning of the skin at first.  These skin changes usually appear first on the backs of the hands. They may then spread to the tops of the feet, the arms, the elbows, the knees, the palms, and the soles of the feet. There may be a mild rash on the lips and lining of the mouth. The skin rash may show up in waves over a few days.  It may take 2-4 weeks for the rash to go away. The rash may return at a later time.  DIAGNOSIS  Diagnosis of erythema multiforme is usually made based on a physical exam and medical history. To help confirm the diagnosis, a small piece of skin tissue is sometimes removed (skin biopsy) so it can be examined under a microscope by a specialist (pathologist). TREATMENT  Most episodes of erythema multiforme heal on their own. Treatment may not be needed. Your health care provider will recommend removing or avoiding the  trigger if possible. If the trigger is an infection or other illness, you may receive treatment for that infection or illness. You may also be given medicine for itching. Other medicines may be used for severe cases or to help prevent repeat bouts of erythema multiforme.  HOME CARE INSTRUCTIONS   Take medicines only as directed by your health care provider.   If possible, avoid known triggers.   If a medicine was your trigger, be sure to notify all of your health care providers. You should avoid this medicine or any like it in the future.   If your trigger was a herpes virus infection, use sunscreen lotion and sunscreen-containing lip balm to prevent sunlight triggered outbreaks of herpes virus.   Apply moist compresses as needed to help control itching. Cool or warm baths may also help. Avoid hot baths or showers.   Eat soft foods if you have mouth sores.   Keep all follow-up visits as directed by your health care provider. This is important.  SEEK MEDICAL CARE IF:   Your rash shows up again in the future.  You have a fever. SEEK IMMEDIATE MEDICAL CARE IF:   You develop redness and swelling on your lips or in your mouth.  You have a burning feeling on your lips or in your mouth.  You develop blisters or open sores on your mouth, lips, vagina, penis, or  anus.  You have eye pain, or you have redness or drainage in your eye.  You develop blisters on your skin.  You have difficulty breathing.  You have difficulty swallowing, or you start drooling.  You have blood in your urine.  You have pain with urination.   This information is not intended to replace advice given to you by your health care provider. Make sure you discuss any questions you have with your health care provider.   Document Released: 09/06/2005 Document Revised: 09/27/2014 Document Reviewed: 04/30/2014 Elsevier Interactive Patient Education [employer].

## 2015-11-03 NOTE — Progress Notes (Signed)
Name: Wesley Vaughn   MRN: [id number]    DOB: 11-16-1975   Date:11/03/2015       Progress Note  Subjective  Chief Complaint  Chief Complaint  Patient presents with  . Rash    started yesterday- spread overnight- fever over weekend    Rash This is a new problem. The current episode started yesterday. The problem has been gradually worsening since onset. The affected locations include the head, face, chest, back, groin, left arm, right arm, right upper leg and left upper leg. The rash is characterized by itchiness and redness (some targeting). Associated symptoms include coughing, a fever, rhinorrhea and shortness of breath. Pertinent negatives include no diarrhea, joint pain or sore throat. His past medical history is significant for asthma.  Cough This is a recurrent problem. The current episode started in the past 7 days. The problem has been gradually worsening. The problem occurs every few minutes. Associated symptoms include chest pain, chills, a fever, myalgias, nasal congestion, postnasal drip, a rash, rhinorrhea, shortness of breath and sweats. Pertinent negatives include no ear congestion, ear pain, headaches, heartburn, hemoptysis, sore throat, weight loss or wheezing. His past medical history is significant for asthma and pneumonia. There is no history of environmental allergies.    No problem-specific assessment & plan notes found for this encounter.   Past Medical History  Diagnosis Date  . Asthma with allergic rhinitis     Past Surgical History  Procedure Laterality Date  . Knee arthroscopy w/ meniscal repair Left     Family History  Problem Relation Age of Onset  . Hyperlipidemia Mother   . Diabetes Mother   . Diabetes Father   . Hyperlipidemia Father   . Depression Father   . Stroke Father   . Hypertension Father     Social History   Social History  . Marital Status: Married    Spouse Name: N/A  . Number of Children: N/A  . Years of Education: N/A    Occupational History  . Not on file.   Social History Main Topics  . Smoking status: Never Smoker   . Smokeless tobacco: Current User    Types: Snuff  . Alcohol Use: 0.0 oz/week    0 Standard drinks or equivalent per week  . Drug Use: No  . Sexual Activity: Yes   Other Topics Concern  . Not on file   Social History Narrative    No Known Allergies   Review of Systems  Constitutional: Positive for fever and chills. Negative for weight loss and malaise/fatigue.  HENT: Positive for postnasal drip and rhinorrhea. Negative for ear discharge, ear pain and sore throat.   Eyes: Negative for blurred vision.  Respiratory: Positive for cough and shortness of breath. Negative for hemoptysis, sputum production and wheezing.   Cardiovascular: Positive for chest pain. Negative for palpitations and leg swelling.  Gastrointestinal: Negative for heartburn, nausea, abdominal pain, diarrhea, constipation, blood in stool and melena.  Genitourinary: Negative for dysuria, urgency, frequency and hematuria.  Musculoskeletal: Positive for myalgias. Negative for back pain, joint pain and neck pain.  Skin: Positive for rash.  Neurological: Negative for dizziness, tingling, sensory change, focal weakness and headaches.  Endo/Heme/Allergies: Negative for environmental allergies and polydipsia. Does not bruise/bleed easily.  Psychiatric/Behavioral: Negative for depression and suicidal ideas. The patient is not nervous/anxious and does not have insomnia.      Objective  Filed Vitals:   11/03/15 0825  BP: 120/80  Pulse: 64  Temp: 98.5 F (36.9  C)  TempSrc: Oral  Height: 5' 8"  (1.727 m)  Weight: 180 lb (81.647 kg)    Physical Exam  Constitutional: He is oriented to person, place, and time and well-developed, well-nourished, and in no distress.  HENT:  Head: Normocephalic.  Right Ear: Tympanic membrane, external ear and ear canal normal.  Left Ear: Tympanic membrane, external ear and ear canal  normal.  Nose: Nose normal.  Mouth/Throat: Posterior oropharyngeal erythema present.  Eyes: Conjunctivae and EOM are normal. Pupils are equal, round, and reactive to light. Right eye exhibits no discharge. Left eye exhibits no discharge. No scleral icterus.  Neck: Normal range of motion. Neck supple. No JVD present. No tracheal deviation present. No thyromegaly present.  Cardiovascular: Normal rate, regular rhythm, normal heart sounds and intact distal pulses.  Exam reveals no gallop and no friction rub.   No murmur heard. Pulmonary/Chest: No respiratory distress. He has decreased breath sounds in the left lower field. He has no wheezes. He has no rales.    Abdominal: Soft. Bowel sounds are normal. He exhibits no mass. There is no hepatosplenomegaly. There is no tenderness. There is no rebound, no guarding and no CVA tenderness.  Musculoskeletal: Normal range of motion. He exhibits no edema or tenderness.  Lymphadenopathy:    He has no cervical adenopathy.  Neurological: He is alert and oriented to person, place, and time. He has normal sensation, normal strength, normal reflexes and intact cranial nerves. No cranial nerve deficit.  Skin: Skin is warm. Rash noted. Rash is urticarial.  Multiple target lesions  Psychiatric: Mood and affect normal.      Assessment & Plan  Problem List Items Addressed This Visit    None    Visit Diagnoses    Left lower lobe pneumonia    -  Primary    Relevant Orders    DG Chest 2 View    Erythema multiforme        Relevant Medications    hydrOXYzine (ATARAX/VISTARIL) 10 MG tablet    Urticaria of entire body        Relevant Medications    hydrOXYzine (ATARAX/VISTARIL) 10 MG tablet         Dr. Deanna Jones [facility]  11/03/2015

## 2015-11-04 ENCOUNTER — Ambulatory Visit
Admission: RE | Admit: 2015-11-04 | Discharge: 2015-11-04 | Disposition: A | Discharge status: Home / Self Care | Payer: BC Managed Care – PPO | Source: Ambulatory Visit | Attending: Infectious Diseases | Admitting: Infectious Diseases

## 2015-11-04 ENCOUNTER — Other Ambulatory Visit: Payer: Self-pay | Admitting: Infectious Diseases

## 2015-11-04 DIAGNOSIS — L519 Erythema multiforme, unspecified: Secondary | ICD-10-CM | POA: Insufficient documentation

## 2015-11-04 DIAGNOSIS — R590 Localized enlarged lymph nodes: Secondary | ICD-10-CM | POA: Diagnosis not present

## 2015-11-04 DIAGNOSIS — Z8701 Personal history of pneumonia (recurrent): Secondary | ICD-10-CM | POA: Insufficient documentation

## 2015-11-04 DIAGNOSIS — J189 Pneumonia, unspecified organism: Secondary | ICD-10-CM | POA: Diagnosis not present

## 2015-11-04 DIAGNOSIS — R918 Other nonspecific abnormal finding of lung field: Secondary | ICD-10-CM | POA: Diagnosis not present

## 2015-11-05 ENCOUNTER — Ambulatory Visit: Payer: BC Managed Care – PPO | Admitting: Family Medicine

## 2015-11-21 ENCOUNTER — Ambulatory Visit (INDEPENDENT_AMBULATORY_CARE_PROVIDER_SITE_OTHER): Payer: BC Managed Care – PPO | Admitting: Family Medicine

## 2015-11-21 ENCOUNTER — Encounter: Payer: Self-pay | Admitting: Family Medicine

## 2015-11-21 VITALS — BP 120/70 | HR 68 | Temp 98.1°F | Ht 68.0 in | Wt 180.0 lb

## 2015-11-21 DIAGNOSIS — J189 Pneumonia, unspecified organism: Secondary | ICD-10-CM

## 2015-11-21 DIAGNOSIS — J984 Other disorders of lung: Secondary | ICD-10-CM

## 2015-11-21 MED ORDER — SULFAMETHOXAZOLE-TRIMETHOPRIM 800-160 MG PO TABS: 1.0000 | ORAL_TABLET | Freq: Two times a day (BID) | ORAL | Status: DC

## 2015-11-21 MED ORDER — HYDROCOD POLST-CPM POLST ER 10-8 MG/5ML PO SUER: 5.0000 mL | Freq: Two times a day (BID) | ORAL | Status: DC

## 2015-11-21 NOTE — Progress Notes (Signed)
Name: Wesley Vaughn   MRN: [id number]    DOB: 12-May-1976   Date:11/21/2015       Progress Note  Subjective  Chief Complaint  Chief Complaint  Patient presents with  . Cough    "can't get rid of the cough"- sees Ola Spurr a week from Monday    Cough This is a recurrent problem. The current episode started 1 to 4 weeks ago. The problem has been waxing and waning. The cough is non-productive. Associated symptoms include ear pain, nasal congestion and shortness of breath. Pertinent negatives include no chest pain, chills, fever, headaches, heartburn, myalgias, postnasal drip, rash, rhinorrhea, sore throat, sweats, weight loss or wheezing. Treatments tried: bactrim/clindamycin. The treatment provided mild relief. There is no history of environmental allergies.    No problem-specific assessment & plan notes found for this encounter.   Past Medical History  Diagnosis Date  . Asthma with allergic rhinitis     Past Surgical History  Procedure Laterality Date  . Knee arthroscopy w/ meniscal repair Left     Family History  Problem Relation Age of Onset  . Hyperlipidemia Mother   . Diabetes Mother   . Diabetes Father   . Hyperlipidemia Father   . Depression Father   . Stroke Father   . Hypertension Father     Social History   Social History  . Marital Status: Married    Spouse Name: N/A  . Number of Children: N/A  . Years of Education: N/A   Occupational History  . Not on file.   Social History Main Topics  . Smoking status: Never Smoker   . Smokeless tobacco: Current User    Types: Snuff  . Alcohol Use: 0.0 oz/week    0 Standard drinks or equivalent per week  . Drug Use: No  . Sexual Activity: Yes   Other Topics Concern  . Not on file   Social History Narrative    No Known Allergies   Review of Systems  Constitutional: Negative for fever, chills, weight loss and malaise/fatigue.  HENT: Positive for ear pain. Negative for ear discharge, postnasal  drip, rhinorrhea and sore throat.   Eyes: Negative for blurred vision.  Respiratory: Positive for cough and shortness of breath. Negative for sputum production and wheezing.   Cardiovascular: Negative for chest pain, palpitations and leg swelling.  Gastrointestinal: Negative for heartburn, nausea, abdominal pain, diarrhea, constipation, blood in stool and melena.  Genitourinary: Negative for dysuria, urgency, frequency and hematuria.  Musculoskeletal: Negative for myalgias, back pain, joint pain and neck pain.  Skin: Negative for rash.  Neurological: Negative for dizziness, tingling, sensory change, focal weakness and headaches.  Endo/Heme/Allergies: Negative for environmental allergies and polydipsia. Does not bruise/bleed easily.  Psychiatric/Behavioral: Negative for depression and suicidal ideas. The patient is not nervous/anxious and does not have insomnia.      Objective  Filed Vitals:   11/21/15 1531  BP: 120/70  Pulse: 68  Temp: 98.1 F (36.7 C)  TempSrc: Oral  Height: 5' 8"  (1.727 m)  Weight: 180 lb (81.647 kg)  SpO2: 99%    Physical Exam  Constitutional: He is oriented to person, place, and time and well-developed, well-nourished, and in no distress.  HENT:  Head: Normocephalic.  Right Ear: External ear normal.  Left Ear: External ear normal.  Nose: Nose normal.  Mouth/Throat: Oropharynx is clear and moist.  Eyes: Conjunctivae and EOM are normal. Pupils are equal, round, and reactive to light. Right eye exhibits no discharge. Left eye exhibits  no discharge. No scleral icterus.  Neck: Normal range of motion. Neck supple. No JVD present. No tracheal deviation present. No thyromegaly present.  Cardiovascular: Normal rate, regular rhythm, normal heart sounds and intact distal pulses.  Exam reveals no gallop and no friction rub.   No murmur heard. Pulmonary/Chest: Breath sounds normal. No respiratory distress. He has no wheezes. He has no rales.  Abdominal: Soft. Bowel  sounds are normal. He exhibits no mass. There is no hepatosplenomegaly. There is no tenderness. There is no rebound, no guarding and no CVA tenderness.  Musculoskeletal: Normal range of motion. He exhibits no edema or tenderness.  Lymphadenopathy:    He has no cervical adenopathy.  Neurological: He is alert and oriented to person, place, and time. He has normal sensation, normal strength, normal reflexes and intact cranial nerves. No cranial nerve deficit.  Skin: Skin is warm. No rash noted.  Psychiatric: Mood and affect normal.  Nursing note and vitals reviewed.     Assessment & Plan  Problem List Items Addressed This Visit    None    Visit Diagnoses    Cavitary pneumonia    -  Primary    Relevant Medications    sulfamethoxazole-trimethoprim (BACTRIM,SEPTRA) 400-80 MG tablet    guaiFENesin-codeine (ROBITUSSIN AC) 100-10 MG/5ML syrup    chlorpheniramine-HYDROcodone (TUSSIONEX PENNKINETIC ER) 10-8 MG/5ML SUER    sulfamethoxazole-trimethoprim (BACTRIM DS,SEPTRA DS) 800-160 MG tablet         Dr. Rondell Frick [facility]  11/21/2015

## 2015-12-01 DIAGNOSIS — J15212 Pneumonia due to Methicillin resistant Staphylococcus aureus: Secondary | ICD-10-CM | POA: Insufficient documentation

## 2015-12-01 DIAGNOSIS — J851 Abscess of lung with pneumonia: Secondary | ICD-10-CM | POA: Insufficient documentation

## 2015-12-16 ENCOUNTER — Other Ambulatory Visit: Payer: Self-pay

## 2015-12-16 MED ORDER — IBUPROFEN 800 MG PO TABS: 800.0000 mg | ORAL_TABLET | Freq: Three times a day (TID) | ORAL | Status: DC | PRN

## 2016-05-03 ENCOUNTER — Encounter: Payer: Self-pay | Admitting: Family Medicine

## 2016-05-03 ENCOUNTER — Ambulatory Visit (INDEPENDENT_AMBULATORY_CARE_PROVIDER_SITE_OTHER): Payer: BC Managed Care – PPO | Admitting: Family Medicine

## 2016-05-03 VITALS — BP 120/82 | HR 80 | Ht 68.0 in | Wt 185.0 lb

## 2016-05-03 DIAGNOSIS — L0100 Impetigo, unspecified: Secondary | ICD-10-CM | POA: Diagnosis not present

## 2016-05-03 DIAGNOSIS — J984 Other disorders of lung: Secondary | ICD-10-CM | POA: Diagnosis not present

## 2016-05-03 DIAGNOSIS — J189 Pneumonia, unspecified organism: Secondary | ICD-10-CM

## 2016-05-03 MED ORDER — SULFAMETHOXAZOLE-TRIMETHOPRIM 800-160 MG PO TABS: 1.0000 | ORAL_TABLET | Freq: Two times a day (BID) | ORAL | 3 refills | Status: DC

## 2016-05-03 MED ORDER — MUPIROCIN 2 % EX OINT: 1.0000 "application " | TOPICAL_OINTMENT | Freq: Two times a day (BID) | CUTANEOUS | 3 refills | Status: DC

## 2016-05-03 NOTE — Progress Notes (Signed)
Name: Wesley Vaughn   MRN: [id number]    DOB: September 16, 1976   Date:05/03/2016       Progress Note  Subjective  Chief Complaint  Chief Complaint  Patient presents with  . Facial Pain    R) nostril has pain on inside, has had some drainage x 2 weeks- "been cleaning out mom's house"    Patient noted swelling/tenderness right nostril.      No problem-specific Assessment & Plan notes found for this encounter.   Past Medical History:  Diagnosis Date  . Asthma with allergic rhinitis     Past Surgical History:  Procedure Laterality Date  . KNEE ARTHROSCOPY W/ MENISCAL REPAIR Left     Family History  Problem Relation Age of Onset  . Hyperlipidemia Mother   . Diabetes Mother   . Diabetes Father   . Hyperlipidemia Father   . Depression Father   . Stroke Father   . Hypertension Father     Social History   Social History  . Marital status: Married    Spouse name: N/A  . Number of children: N/A  . Years of education: N/A   Occupational History  . Not on file.   Social History Main Topics  . Smoking status: Never Smoker  . Smokeless tobacco: Current User    Types: Snuff  . Alcohol use 0.0 oz/week  . Drug use: No  . Sexual activity: Yes   Other Topics Concern  . Not on file   Social History Narrative  . No narrative on file    No Known Allergies   Review of Systems  Constitutional: Negative for chills, fever, malaise/fatigue and weight loss.  HENT: Negative for congestion, ear discharge, ear pain and sore throat.   Eyes: Negative for blurred vision.  Respiratory: Negative for cough, sputum production, shortness of breath and wheezing.   Cardiovascular: Negative for chest pain, palpitations and leg swelling.  Gastrointestinal: Negative for abdominal pain, blood in stool, constipation, diarrhea, heartburn, melena and nausea.  Genitourinary: Negative for dysuria, frequency, hematuria and urgency.  Musculoskeletal: Negative for back pain, joint pain,  myalgias and neck pain.  Skin: Negative for rash.  Neurological: Negative for dizziness, tingling, sensory change, focal weakness and headaches.  Endo/Heme/Allergies: Negative for environmental allergies and polydipsia. Does not bruise/bleed easily.  Psychiatric/Behavioral: Negative for depression and suicidal ideas. The patient is not nervous/anxious and does not have insomnia.      Objective  Vitals:   05/03/16 1040  BP: 120/82  Pulse: 80  Weight: 185 lb (83.9 kg)  Height: 5' 8"  (1.727 m)    Physical Exam  Constitutional: He is oriented to person, place, and time and well-developed, well-nourished, and in no distress.  HENT:  Head: Normocephalic.  Right Ear: External ear normal.  Left Ear: External ear normal.  Nose: Nose normal.  Mouth/Throat: Oropharynx is clear and moist.  Eyes: Conjunctivae and EOM are normal. Pupils are equal, round, and reactive to light. Right eye exhibits no discharge. Left eye exhibits no discharge. No scleral icterus.  Neck: Normal range of motion. Neck supple. No JVD present. No tracheal deviation present. No thyromegaly present.  Cardiovascular: Normal rate, regular rhythm, normal heart sounds and intact distal pulses.  Exam reveals no gallop and no friction rub.   No murmur heard. Pulmonary/Chest: Breath sounds normal. No respiratory distress. He has no wheezes. He has no rales.  Abdominal: Soft. Bowel sounds are normal. He exhibits no mass. There is no hepatosplenomegaly. There is no tenderness. There  is no rebound, no guarding and no CVA tenderness.  Musculoskeletal: Normal range of motion. He exhibits no edema or tenderness.  Lymphadenopathy:    He has no cervical adenopathy.  Neurological: He is alert and oriented to person, place, and time. He has normal sensation, normal strength and intact cranial nerves. No cranial nerve deficit.  Skin: Skin is warm. No rash noted.  Psychiatric: Mood and affect normal.  Nursing note and vitals  reviewed.     Assessment & Plan  Problem List Items Addressed This Visit      Respiratory   Cavitary pneumonia   Relevant Medications   sulfamethoxazole-trimethoprim (BACTRIM DS,SEPTRA DS) 800-160 MG tablet    Other Visit Diagnoses    Impetigo    -  Primary   Relevant Medications   sulfamethoxazole-trimethoprim (BACTRIM DS,SEPTRA DS) 800-160 MG tablet   mupirocin ointment (BACTROBAN) 2 %        Dr. Antaniya Venuti [facility]  05/03/16

## 2017-02-08 IMAGING — CR DG CHEST 2V
2 series · 2 of 2 positions shown · non-contrast
Comparison: 07/15/2008

CLINICAL DATA: Chest pain beginning tonight

EXAM:
CHEST  2 VIEW

[chest pa]
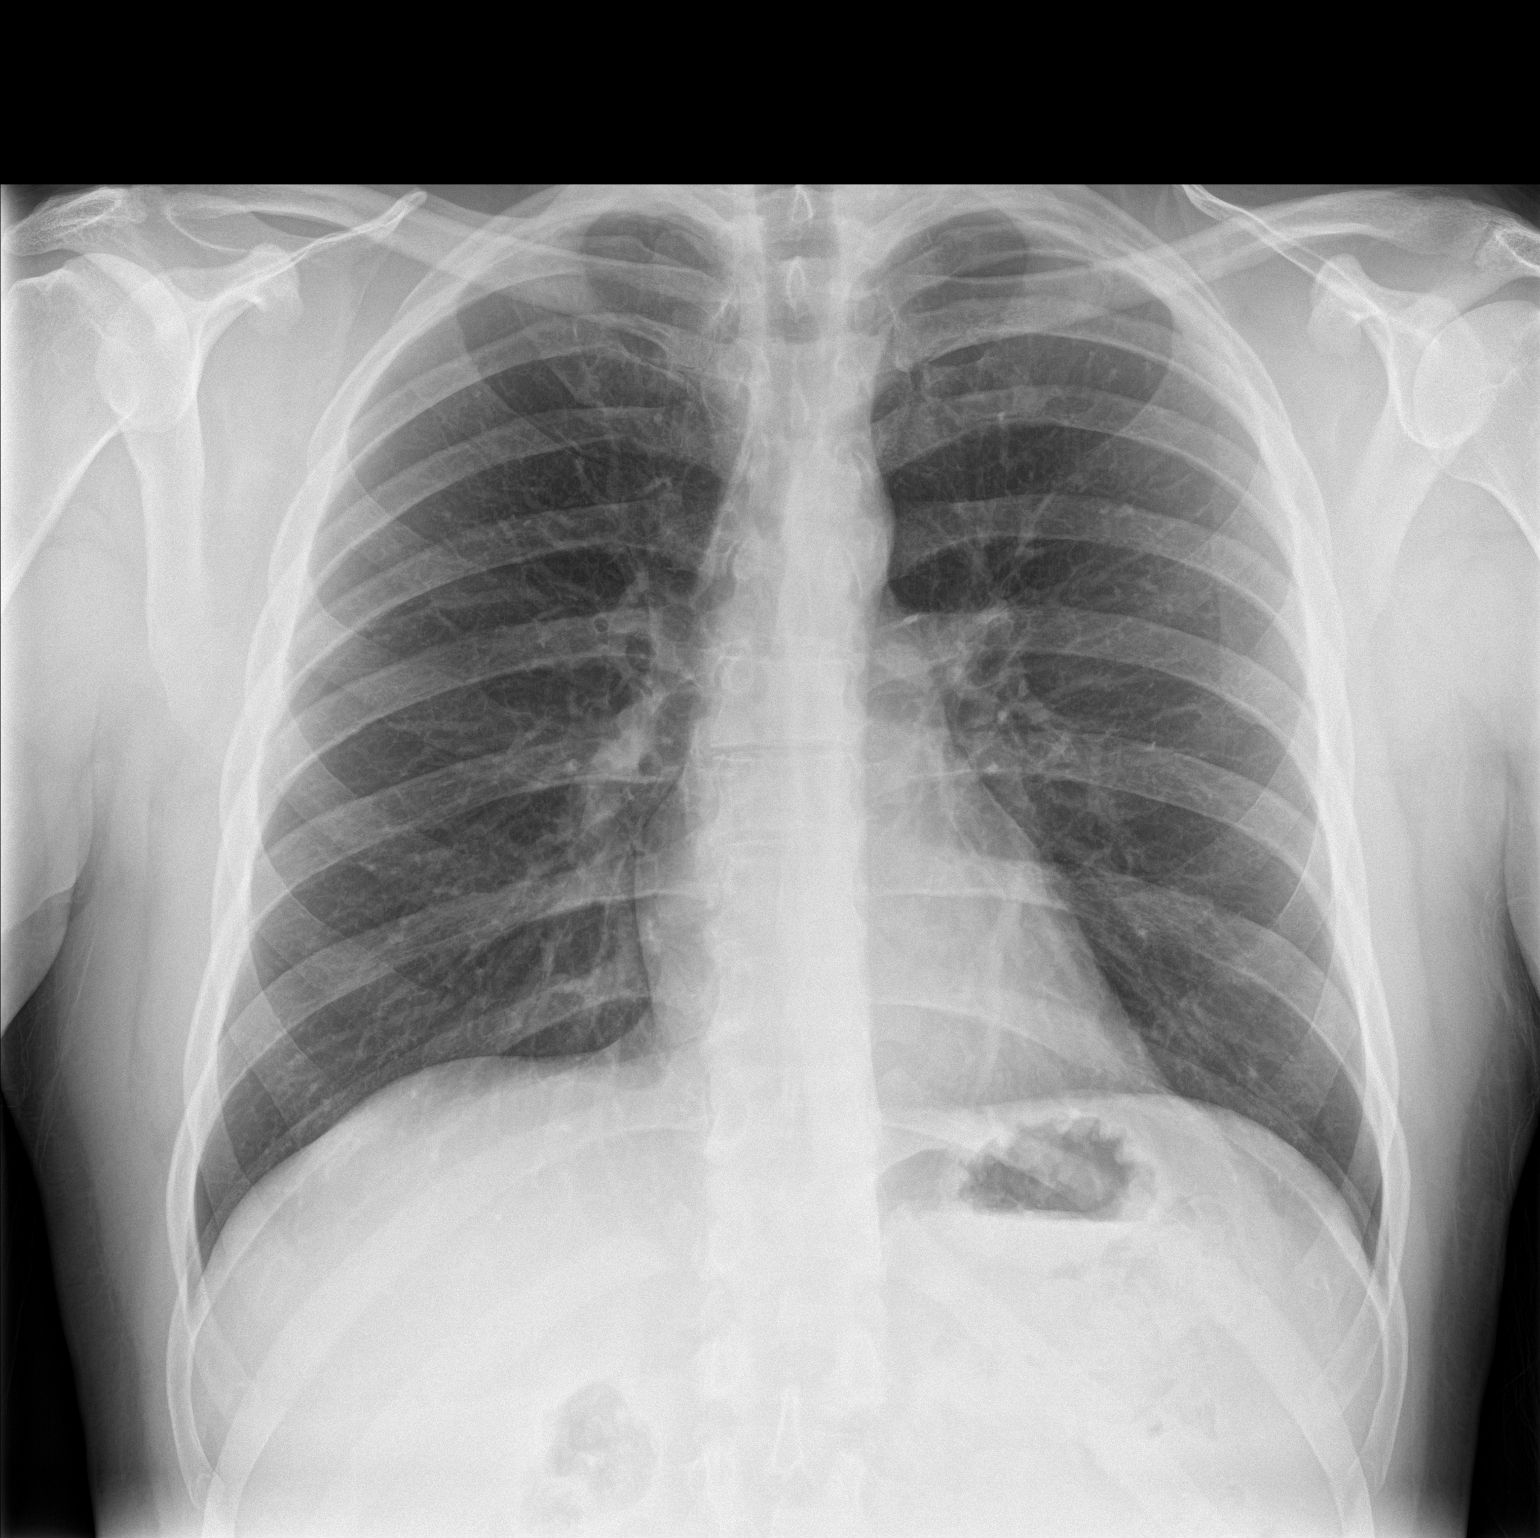

[chest lat]
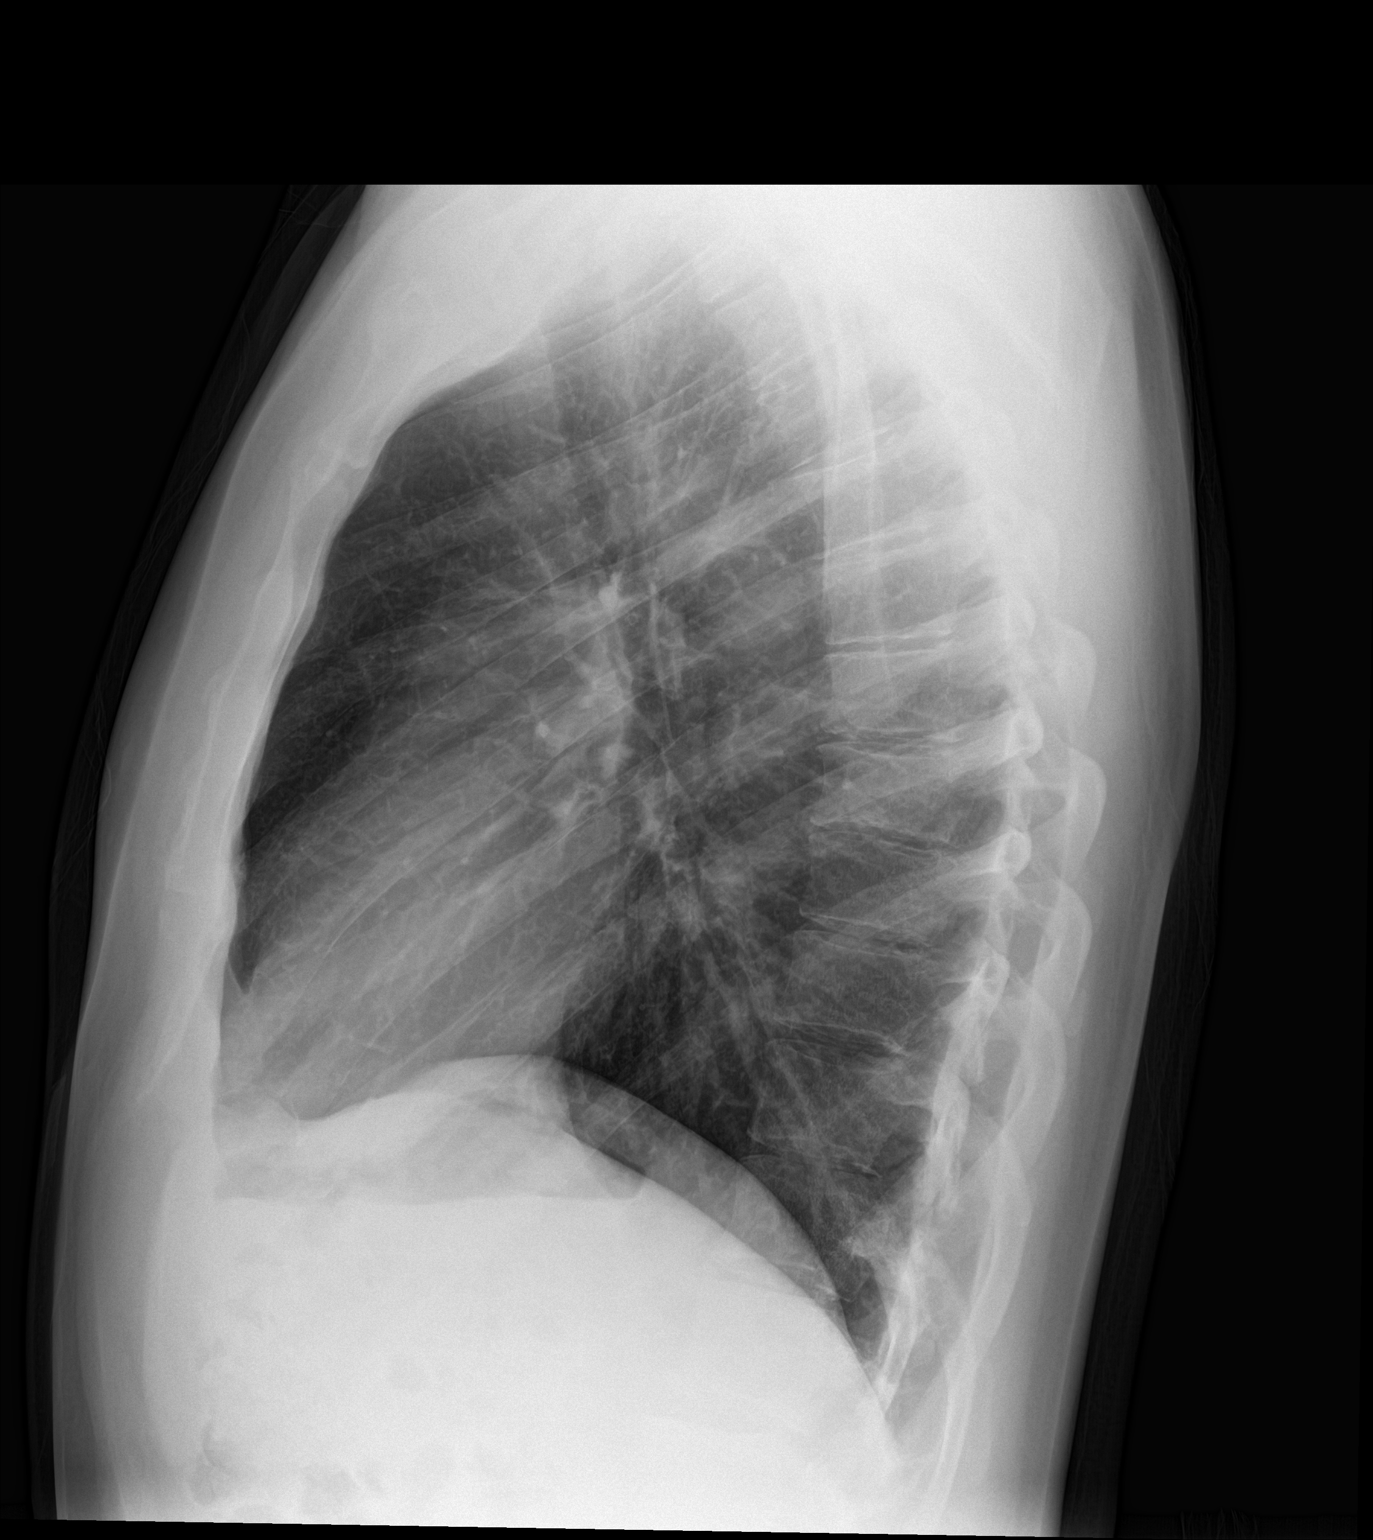

[2 of 2 positions shown; findings below may reference images not displayed]

FINDINGS: Normal heart size and mediastinal contours. No acute infiltrate or
edema. No effusion or pneumothorax. No acute osseous findings.
IMPRESSION: Negative chest.

## 2017-02-10 IMAGING — CR DG CHEST 2V
2 series · 2 of 2 positions shown · non-contrast
Comparison: PA and lateral chest x-ray October 19, 2015

CLINICAL DATA: Two days of left anterior lateral chest pain ; may
have injured himself while wrestling on [REDACTED]; history of
exercise-induced asthma and previous episodes of pneumonia;
nonsmoker.

EXAM:
CHEST  2 VIEW

[chest pa]
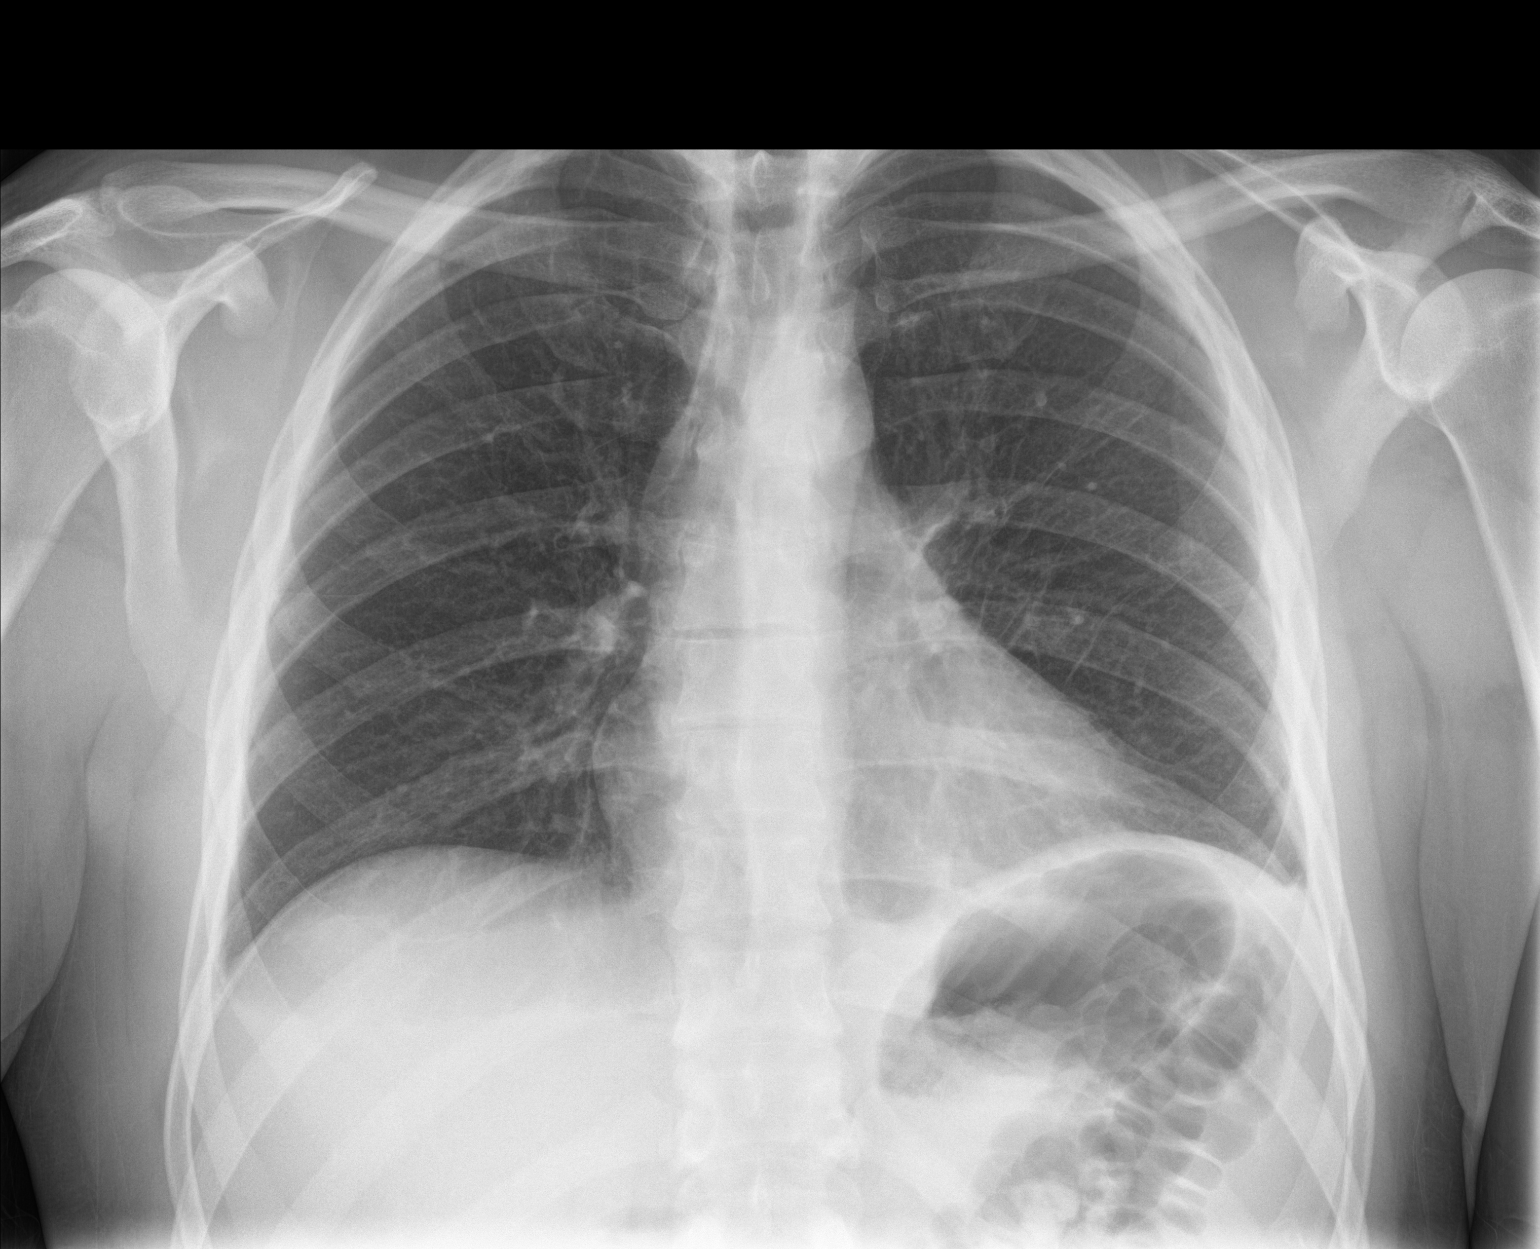

[chest lat]
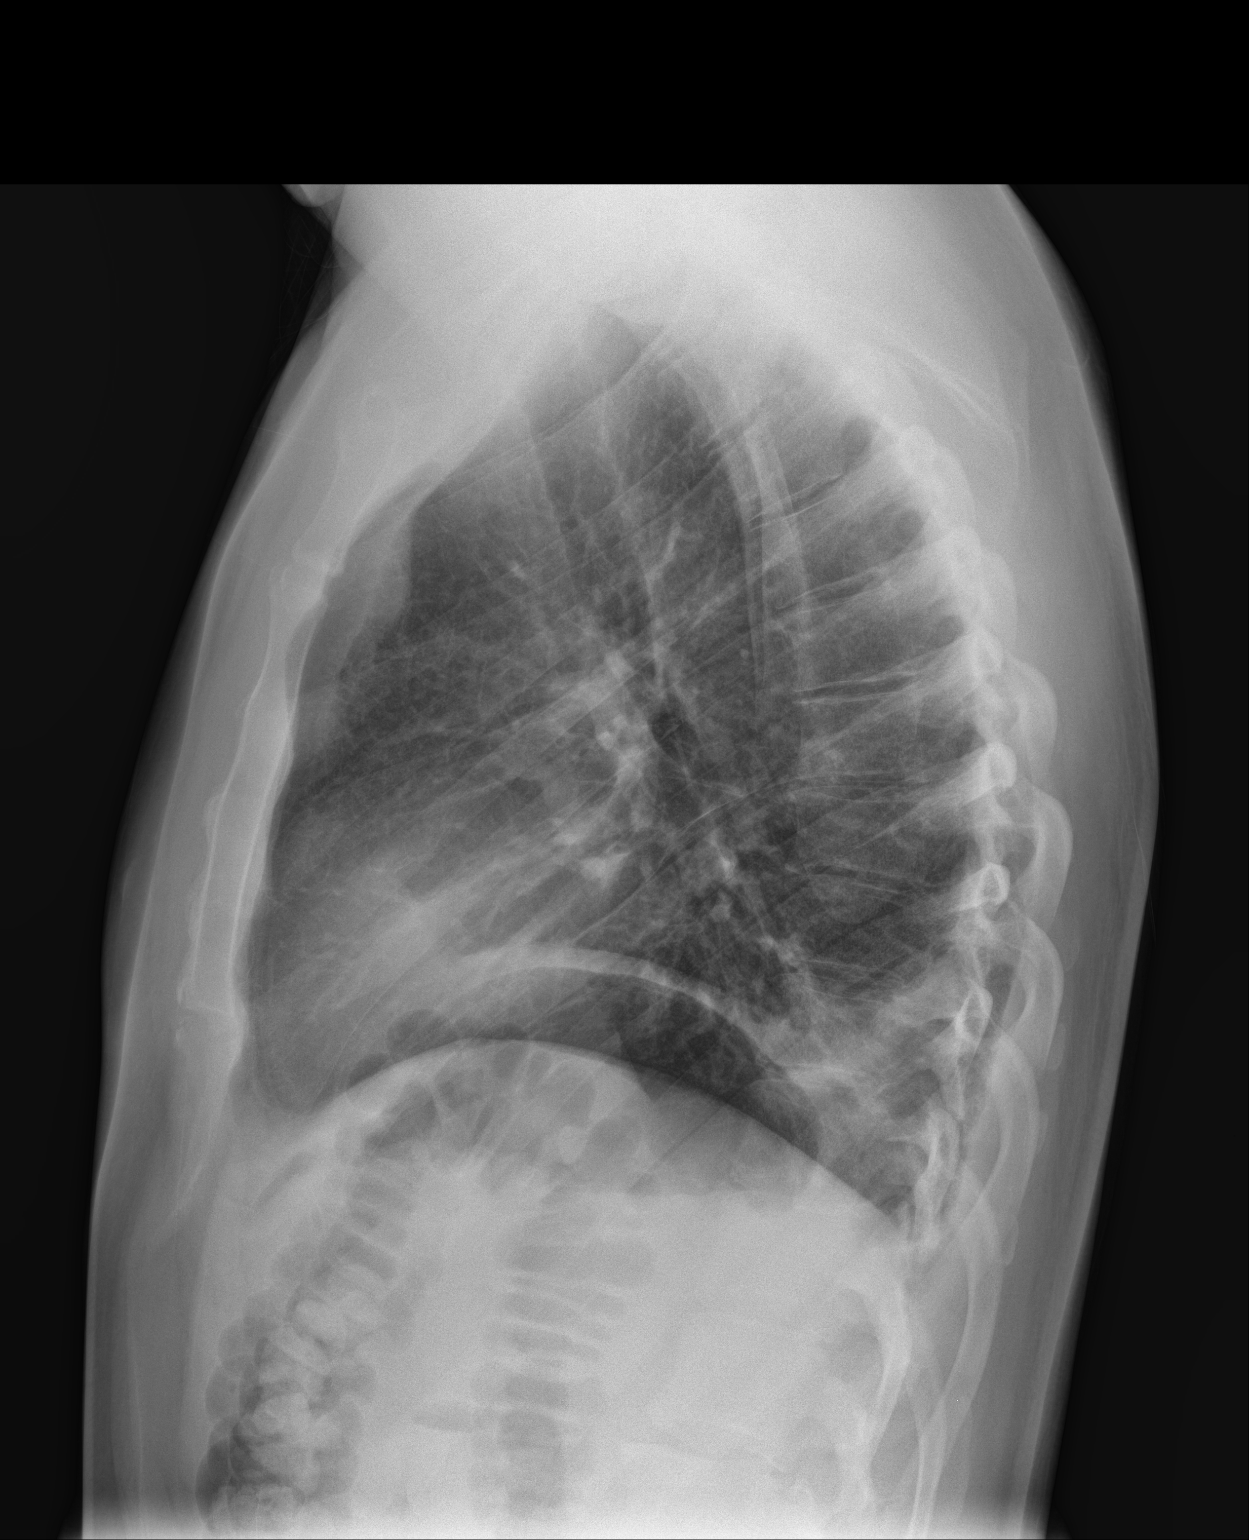

[2 of 2 positions shown; findings below may reference images not displayed]

FINDINGS: There is patchy infiltrate posteriorly in the left lower lobe. A
trace of pleural fluid on the left is suspected. There is no
pneumothorax. The right lung is clear. The heart and pulmonary
vascularity are normal. The mediastinum is normal in width. The bony
thorax exhibits no acute abnormalities.
IMPRESSION: Findings consistent with left lower lobe pneumonia posteriorly.
There is a small left pleural effusion. There is no pneumothorax or
pneumomediastinum. No rib fracture is observed.

## 2017-05-12 ENCOUNTER — Encounter: Payer: Self-pay | Admitting: Family Medicine

## 2017-05-12 ENCOUNTER — Ambulatory Visit (INDEPENDENT_AMBULATORY_CARE_PROVIDER_SITE_OTHER): Payer: BC Managed Care – PPO | Admitting: Family Medicine

## 2017-05-12 ENCOUNTER — Ambulatory Visit: Payer: BC Managed Care – PPO | Admitting: Family Medicine

## 2017-05-12 ENCOUNTER — Ambulatory Visit: Payer: BC Managed Care – PPO | Admitting: Surgery

## 2017-05-12 VITALS — BP 110/80 | HR 76 | Ht 68.0 in | Wt 170.0 lb

## 2017-05-12 DIAGNOSIS — Z23 Encounter for immunization: Secondary | ICD-10-CM

## 2017-05-12 DIAGNOSIS — K4090 Unilateral inguinal hernia, without obstruction or gangrene, not specified as recurrent: Secondary | ICD-10-CM | POA: Diagnosis not present

## 2017-05-12 NOTE — Patient Instructions (Addendum)
Snapping Hip Syndrome Snapping hip syndrome is a condition that causes a "snapping" or "popping" feeling in your hip, especially when you walk, stand up from a chair, or swing your leg. Strong bands of tissue (tendons) attach the muscles in your buttocks, thighs, and pelvis to the bones of your hip. Snapping hip syndrome typically happens when a muscle or tendon moves across a bony part of your hip. Snapping hip can also involve torn or loose structures within the joint. This is less common. Snapping hip syndrome can affect different areas of your hip, including the front, side, or back. What are the causes? This condition is typically caused by tight tendons or muscles around the hip. This often happens from overuse. What increases the risk? The following factors may make you more likely to develop this condition:  Being younger than age 30.  Being a Tourist information centre manager, runner, weight lifter, gymnast, or soccer player.  Having had an injury to your hip or pelvis.  Having an abnormally shaped pelvis or leg (deformity).  What are the signs or symptoms? Symptoms of this condition include:  A "snapping" or "popping" sensation in the front, side, or back of the hip when moving your leg. This can cause pain. The pain typically goes away when you stop moving.  Tightness in the hip.  Swelling in the front or side of the hip.  Leg weakness, especially when trying to lift it up or sideways.  Difficulty getting out of low chairs.  How is this diagnosed? This condition is diagnosed based on your symptoms, medical history, and physical exam. Your health care provider may have you move your leg into certain positions and test your muscle strength. You may have an X-ray, ultrasound, or MRI to rule out other causes of pain. You may also have an injection of numbing medicine to see if that reduces your symptoms. How is this treated? Treatment for this condition includes:  Stopping all activities that cause  pain or make your condition worse.  Icing your hip to relieve pain.  Taking NSAIDs or getting corticosteroid injections to reduce pain and swelling.  Doing range-of-motion and strengthening exercises (physical therapy) as told by your health care provider.  You may need surgery to loosen your muscle and tendon or to remove any loose pieces of cartilage if other treatments do not work. Follow these instructions at home:  Take over-the-counter and prescription medicines only as told by your health care provider.  If directed, apply ice to the injured area. ? Put ice in a plastic bag. ? Place a towel between your skin and the bag. ? Leave the ice on for 20 minutes, 2-3 times a day.  Do physical therapy as told by your health care provider.  Return to your normal activities as told by your health care provider. Ask your health care provider what activities are safe for you.  Keep all follow-up visits as told by your health care provider. This is important. How is this prevented?  Warm up and stretch before being active.  Cool down and stretch after being active.  Give your body time to rest between periods of activity.  Maintain physical fitness, including: ? Strength. ? Flexibility. Contact a health care provider if:  You have pain, swelling, and difficulty moving that gets worse.  Your leg or hip feels weak and feels like it cannot hold you up.  You cannot stand or walk without severe pain. This information is not intended to replace advice given to  you by your health care provider. Make sure you discuss any questions you have with your health care provider. Document Released: 09/06/2005 Document Revised: 05/11/2016 Document Reviewed: 08/19/2015 Elsevier Interactive Patient Education  2018 [place], [employer] A hernia is the bulging of an organ or tissue through a weak spot in the muscles of the abdomen (abdominal wall). Hernias develop most often near the navel or  groin. There are many kinds of hernias. Common kinds include:  Femoral hernia. This kind of hernia develops under the groin in the upper thigh area.  Inguinal hernia. This kind of hernia develops in the groin or scrotum.  Umbilical hernia. This kind of hernia develops near the navel.  Hiatal hernia. This kind of hernia causes part of the stomach to be pushed up into the chest.  Incisional hernia. This kind of hernia bulges through a scar from an abdominal surgery.  What are the causes? This condition may be caused by:  Heavy lifting.  Coughing over a long period of time.  Straining to have a bowel movement.  An incision made during an abdominal surgery.  A birth defect (congenital defect).  Excess weight or obesity.  Smoking.  Poor nutrition.  Cystic fibrosis.  Excess fluid in the abdomen.  Undescended testicles.  What are the signs or symptoms? Symptoms of a hernia include:  A lump on the abdomen. This is the first sign of a hernia. The lump may become more obvious with standing, straining, or coughing. It may get bigger over time if it is not treated or if the condition causing it is not treated.  Pain. A hernia is usually painless, but it may become painful over time if treatment is delayed. The pain is usually dull and may get worse with standing or lifting heavy objects.  Sometimes a hernia gets tightly squeezed in the weak spot (strangulated) or stuck there (incarcerated) and causes additional symptoms. These symptoms may include:  Vomiting.  Nausea.  Constipation.  Irritability.  How is this diagnosed? A hernia may be diagnosed with:  A physical exam. During the exam your health care provider may ask you to cough or to make a specific movement, because a hernia is usually more visible when you move.  Imaging tests. These can include: ? X-rays. ? Ultrasound. ? CT scan.  How is this treated? A hernia that is small and painless may not need to be  treated. A hernia that is large or painful may be treated with surgery. Inguinal hernias may be treated with surgery to prevent incarceration or strangulation. Strangulated hernias are always treated with surgery, because lack of blood to the trapped organ or tissue can cause it to die. Surgery to treat a hernia involves pushing the bulge back into place and repairing the weak part of the abdomen. Follow these instructions at home:  Avoid straining.  Do not lift anything heavier than 10 lb (4.5 kg).  Lift with your leg muscles, not your back muscles. This helps avoid strain.  When coughing, try to cough gently.  Prevent constipation. Constipation leads to straining with bowel movements, which can make a hernia worse or cause a hernia repair to break down. You can prevent constipation by: ? Eating a high-fiber diet that includes plenty of fruits and vegetables. ? Drinking enough fluids to keep your urine clear or pale yellow. Aim to drink 6-8 glasses of water per day. ? Using a stool softener as directed by your health care provider.  Lose weight, if you  are overweight.  Do not use any tobacco products, including cigarettes, chewing tobacco, or electronic cigarettes. If you need help quitting, ask your health care provider.  Keep all follow-up visits as directed by your health care provider. This is important. Your health care provider may need to monitor your condition. Contact a health care provider if:  You have swelling, redness, and pain in the affected area.  Your bowel habits change. Get help right away if:  You have a fever.  You have abdominal pain that is getting worse.  You feel nauseous or you vomit.  You cannot push the hernia back in place by gently pressing on it while you are lying down.  The hernia: ? Changes in shape or size. ? Is stuck outside the abdomen. ? Becomes discolored. ? Feels hard or tender. This information is not intended to replace advice given  to you by your health care provider. Make sure you discuss any questions you have with your health care provider. Document Released: 09/06/2005 Document Revised: 02/04/2016 Document Reviewed: 07/17/2014 Elsevier Interactive Patient Education  2017 [employer].

## 2017-05-12 NOTE — Progress Notes (Signed)
Name: Wesley Vaughn   MRN: [id number]    DOB: 1976/06/15   Date:05/12/2017       Progress Note  Subjective  Chief Complaint  Chief Complaint  Patient presents with  . Groin Pain    hurt x 2 months- gets worse when lifting overhead or walking upstairs    Groin Pain  The patient's pertinent negatives include no genital injury, genital itching, pelvic pain, scrotal swelling or testicular pain. This is a new problem. The current episode started more than 1 month ago (2 months). The problem occurs daily. The problem has been waxing and waning. The pain is mild. Pertinent negatives include no abdominal pain, anorexia, chest pain, chills, constipation, coughing, diarrhea, discolored urine, dysuria, fever, flank pain, frequency, headaches, hematuria, hesitancy, joint pain, joint swelling, nausea, painful intercourse, rash, shortness of breath, sore throat, urgency, urinary retention or vomiting.    No problem-specific Assessment & Plan notes found for this encounter.   Past Medical History:  Diagnosis Date  . Asthma with allergic rhinitis     Past Surgical History:  Procedure Laterality Date  . KNEE ARTHROSCOPY W/ MENISCAL REPAIR Left     Family History  Problem Relation Age of Onset  . Hyperlipidemia Mother   . Diabetes Mother   . Diabetes Father   . Hyperlipidemia Father   . Depression Father   . Stroke Father   . Hypertension Father     Social History   Social History  . Marital status: Married    Spouse name: N/A  . Number of children: N/A  . Years of education: N/A   Occupational History  . Not on file.   Social History Main Topics  . Smoking status: Never Smoker  . Smokeless tobacco: Current User    Types: Snuff  . Alcohol use 0.0 oz/week  . Drug use: No  . Sexual activity: Yes   Other Topics Concern  . Not on file   Social History Narrative  . No narrative on file    No Known Allergies  Outpatient Medications Prior to Visit  Medication Sig  Dispense Refill  . loratadine (CLARITIN) 10 MG tablet Take 10 mg by mouth daily as needed for allergies. Reported on 11/21/2015    . mupirocin ointment (BACTROBAN) 2 % Place 1 application into the nose 2 (two) times daily. 22 g 3  . sulfamethoxazole-trimethoprim (BACTRIM DS,SEPTRA DS) 800-160 MG tablet Take 1 tablet by mouth 2 (two) times daily. 20 tablet 3   No facility-administered medications prior to visit.     Review of Systems  Constitutional: Negative for chills, fever, malaise/fatigue and weight loss.  HENT: Negative for ear discharge, ear pain and sore throat.   Eyes: Negative for blurred vision.  Respiratory: Negative for cough, sputum production, shortness of breath and wheezing.   Cardiovascular: Negative for chest pain, palpitations and leg swelling.  Gastrointestinal: Negative for abdominal pain, anorexia, blood in stool, constipation, diarrhea, heartburn, melena, nausea and vomiting.  Genitourinary: Negative for dysuria, flank pain, frequency, hematuria, hesitancy, pelvic pain, scrotal swelling, testicular pain and urgency.  Musculoskeletal: Negative for back pain, joint pain, myalgias and neck pain.  Skin: Negative for rash.  Neurological: Negative for dizziness, tingling, sensory change, focal weakness and headaches.  Endo/Heme/Allergies: Negative for environmental allergies and polydipsia. Does not bruise/bleed easily.  Psychiatric/Behavioral: Negative for depression and suicidal ideas. The patient is not nervous/anxious and does not have insomnia.      Objective  Vitals:   05/12/17 0835  BP: 110/80  Pulse: 76  Weight: 170 lb (77.1 kg)  Height: 5' 8"  (1.727 m)    Physical Exam  Constitutional: He is oriented to person, place, and time and well-developed, well-nourished, and in no distress.  HENT:  Head: Normocephalic.  Right Ear: External ear normal.  Left Ear: External ear normal.  Nose: Nose normal.  Mouth/Throat: Oropharynx is clear and moist.  Eyes:  Pupils are equal, round, and reactive to light. Conjunctivae and EOM are normal. Right eye exhibits no discharge. Left eye exhibits no discharge. No scleral icterus.  Neck: Normal range of motion. Neck supple. No JVD present. No tracheal deviation present. No thyromegaly present.  Cardiovascular: Normal rate, regular rhythm, normal heart sounds and intact distal pulses.  Exam reveals no gallop and no friction rub.   No murmur heard. Pulmonary/Chest: Breath sounds normal. No respiratory distress. He has no wheezes. He has no rales.  Abdominal: Soft. Bowel sounds are normal. He exhibits no mass. There is no hepatosplenomegaly. There is no tenderness. There is no rebound, no guarding and no CVA tenderness.  Genitourinary: He exhibits scrotal tenderness. He exhibits no abnormal testicular mass, no testicular tenderness and no abnormal scrotal mass.  Genitourinary Comments: Inguinal ring open/tender right scrotal area  Musculoskeletal: Normal range of motion. He exhibits no edema or tenderness.  Lymphadenopathy:    He has no cervical adenopathy.  Neurological: He is alert and oriented to person, place, and time. He has normal sensation, normal strength, normal reflexes and intact cranial nerves. No cranial nerve deficit.  Skin: Skin is warm. No rash noted.  Psychiatric: Mood and affect normal.  Nursing note and vitals reviewed.     Assessment & Plan  Problem List Items Addressed This Visit    None    Visit Diagnoses    Inguinal hernia of right side without obstruction or gangrene    -  Primary   Relevant Orders   Ambulatory referral to General Surgery   Need for diphtheria-tetanus-pertussis (Tdap) vaccine          No orders of the defined types were placed in this encounter.     Dr. [facility]  05/12/17

## 2017-05-13 ENCOUNTER — Encounter: Payer: Self-pay | Admitting: Surgery

## 2017-05-13 ENCOUNTER — Ambulatory Visit (INDEPENDENT_AMBULATORY_CARE_PROVIDER_SITE_OTHER): Payer: BC Managed Care – PPO | Admitting: Surgery

## 2017-05-13 VITALS — BP 120/82 | HR 67 | Temp 98.6°F | Ht 68.0 in | Wt 170.6 lb

## 2017-05-13 DIAGNOSIS — J45990 Exercise induced bronchospasm: Secondary | ICD-10-CM | POA: Insufficient documentation

## 2017-05-13 DIAGNOSIS — R1031 Right lower quadrant pain: Secondary | ICD-10-CM

## 2017-05-13 DIAGNOSIS — J309 Allergic rhinitis, unspecified: Secondary | ICD-10-CM | POA: Insufficient documentation

## 2017-05-13 NOTE — Patient Instructions (Signed)
Please call with any questions or concerns.

## 2017-05-14 NOTE — Progress Notes (Signed)
Surgical Clinic History and Physical  Referring provider:  Juline Patch, MD [address] [place], [place] [address]  HISTORY OF PRESENT ILLNESS (HPI):  41 y.o. male presents for evaluation of Right groin pain. Patient reports he first notice the Right groin pain 2 months ago, since which time he describes mild intermittent Right groin pain, worse with strenuous activity, sex, and heavy lifting. He says he has not noticed any bulge and initially thought he pulled a muscle. He otherwise denies constipation, dysuria or straining with urination, increased or frequent cough, N/V, diarrhea, fever/chills, CP, or SOB. He likewise denies any similar prior episodes.  PAST MEDICAL HISTORY (PMH):  Past Medical History:  Diagnosis Date  . Asthma with allergic rhinitis      PAST SURGICAL HISTORY ([place]):  Past Surgical History:  Procedure Laterality Date  . KNEE ARTHROSCOPY W/ MENISCAL REPAIR Left      MEDICATIONS:  Prior to Admission medications   Medication Sig Start Date End Date Taking? Authorizing Provider  loratadine (CLARITIN) 10 MG tablet Take 10 mg by mouth daily as needed for allergies. Reported on 11/21/2015   Yes [provider]     ALLERGIES:  No Known Allergies   SOCIAL HISTORY:  Social History   Social History  . Marital status: Married    Spouse name: N/A  . Number of children: N/A  . Years of education: N/A   Occupational History  . Not on file.   Social History Main Topics  . Smoking status: Never Smoker  . Smokeless tobacco: Current User    Types: Snuff  . Alcohol use 0.0 oz/week  . Drug use: No  . Sexual activity: Yes   Other Topics Concern  . Not on file   Social History Narrative  . No narrative on file    The patient currently resides (home / rehab facility / nursing home): Home  The patient normally is (ambulatory / bedbound): Ambulatory   FAMILY HISTORY:  Family History  Problem Relation Age of Onset  . Hyperlipidemia  Mother   . Diabetes Mother   . Diabetes Father   . Hyperlipidemia Father   . Depression Father   . Stroke Father   . Hypertension Father     Otherwise negative/non-contributory.  REVIEW OF SYSTEMS:  Constitutional: denies any other weight loss, fever, chills, or sweats  Eyes: denies any other vision changes, history of eye injury  ENT: denies sore throat, hearing problems  Respiratory: denies shortness of breath, wheezing  Cardiovascular: denies chest pain, palpitations  Gastrointestinal: abdominal pain, N/V, and bowel function as per HPI Musculoskeletal: denies any other joint pains or cramps  Skin: Denies any other rashes or skin discolorations  Neurological: denies any other headache, dizziness, weakness  Psychiatric: Denies any other depression, anxiety   All other review of systems were otherwise negative   VITAL SIGNS:  @VSRANGES @     Height: 5' 8"  (172.7 cm) Weight: 170 lb 9.6 oz (77.4 kg) BMI (Calculated): 25.95   PHYSICAL EXAM:  Constitutional:  -- Normal body habitus  -- Awake, alert, and oriented x3  Eyes:  -- Pupils equally round and reactive to light  -- No scleral icterus  Ear, nose, throat:  -- No jugular venous distension -- No nasal drainage, bleeding Pulmonary:  -- No crackles  -- Equal breath sounds bilaterally -- Breathing non-labored at rest Cardiovascular:  -- S1, S2 present  -- No pericardial rubs  Gastrointestinal:  -- Abdomen soft, nontender, nondistended, no guarding/rebound -- +  Right external inguinal ring and groin tenderness to palpation without any hernia able to be provoked -- No abdominal masses appreciated, pulsatile or otherwise, no scrotal tenderness to palpation Musculoskeletal and Integumentary:  -- Wounds or skin discoloration: None appreciated -- Extremities: B/L UE and LE FROM, hands and feet warm, no edema  Neurologic:  -- Motor function: Intact and symmetric -- Sensation: Intact and symmetric  Labs:  CBC:  Lab Results   Component Value Date   WBC 15.3 (H) 11/03/2015   RBC 5.21 11/03/2015   BMP:  Lab Results  Component Value Date   GLUCOSE 105 (H) 10/19/2015   GLUCOSE 97 09/02/2014   CO2 26 10/19/2015   CO2 30 09/02/2014   BUN 17 10/19/2015   BUN 13 09/02/2014   CREATININE 1.09 10/19/2015   CREATININE 1.23 09/02/2014   CALCIUM 9.8 10/19/2015   CALCIUM 9.4 09/02/2014     Imaging studies: No pertinent imaging studies available for review   Assessment/Plan:  41 y.o. male with mild intermittent Right groin tenderness of likely musculoskeletal etiology (such as pulled muscle/tendon or torn ligament) with no hernia able to be appreciated, complicated by co-morbidities including asthma, prior cavitary pneumonia, and chronic chewing tobacco abuse.   - heat/cold + NSAID's prn for pain relief   - minimize strenuous activity and heavy lifting x 6 weeks to allow for full recovery   - call if hernia becomes evident, and otherwise as needed  All of the above recommendations were discussed with the patient, and all of patient's questions were answered to his expressed satisfaction.  Thank you for the opportunity to participate in this patient's care.  -- Marilynne Drivers Rosana Hoes, MD, [place]: [facility] - Partnering for exceptional care. Office: [phone]

## 2017-11-18 ENCOUNTER — Ambulatory Visit: Payer: BC Managed Care – PPO | Admitting: Family Medicine

## 2017-11-18 ENCOUNTER — Encounter: Payer: Self-pay | Admitting: Family Medicine

## 2017-11-18 VITALS — BP 120/70 | HR 60 | Ht 67.0 in | Wt 172.0 lb

## 2017-11-18 DIAGNOSIS — Z23 Encounter for immunization: Secondary | ICD-10-CM

## 2017-11-18 DIAGNOSIS — S39011S Strain of muscle, fascia and tendon of abdomen, sequela: Secondary | ICD-10-CM | POA: Diagnosis not present

## 2017-11-18 MED ORDER — MELOXICAM 15 MG PO TABS: 15.0000 mg | ORAL_TABLET | Freq: Every day | ORAL | 0 refills | Status: DC

## 2017-11-18 NOTE — Patient Instructions (Signed)
Adductor Muscle Strain An adductor muscle strain, also called a groin strain or pull, is an injury to the muscles or tendon on the upper inner part of the thigh. These muscles are called the adductor muscles or groin muscles. They are responsible for moving the leg across the body or pulling the legs together. A muscle strain occurs when a muscle is overstretched and some muscle fibers are torn. An adductor muscle strain can range from mild to severe, depending on how many muscle fibers are affected and whether the muscle fibers are partially or completely torn. Adductor muscle strains usually occur during exercise or participation in sports. The injury often happens when a sudden, violent force is placed on a muscle, stretching the muscle too far. A strain is more likely to occur when your muscles are not warmed up or if you are not properly conditioned. Depending on the severity of the muscle strain, recovery time may vary from a few weeks to several weeks. Severe injuries often require 4-6 weeks for recovery. In those cases, complete healing can take 4-5 months. What are the causes? This injury may be caused by:  Stretching the adductor muscles too far or too suddenly, often during side-to-side motion with an abrupt change in direction.  Putting repeated stress on the adductor muscles over a long period of time.  Performing vigorous activity without properly stretching the adductor muscles beforehand.  What are the signs or symptoms? Symptoms of this condition include:  Pain and tenderness in the groin area. This begins as sharp pain and persists as a dull ache.  A popping or snapping feeling when the injury occurs (for severe strains).  Swelling or bruising.  Muscle spasms.  Weakness in the leg.  Stiffness in the groin area with decreased ability to move the affected muscles.  How is this diagnosed? This condition may be diagnosed based on your symptoms, your description of how the  injury occurred, and a physical exam. X-rays are sometimes needed to rule out a broken bone or cartilage problems. A CT scan or MRI may be done if your health care provider suspects a complete muscle tear or needs to check for other injuries. How is this treated? An adductor strain will often heal on its own. Typically, treatment will involve protecting the injured area, rest, ice, pressure (compression), and elevation. This is often called PRICE therapy. Your health care provider may also prescribe medicines to help manage pain and swelling (anti-inflammatory medicine). You may be told to use crutches for the first few days to minimize your pain. Follow these instructions at home: [place] the muscle from being injured again.  Rest. Do not use the strained muscle if it causes pain.  If directed, apply ice to the injured area: ? Put ice in a plastic bag. ? Place a towel between your skin and the bag. ? Leave the ice on for 20 minutes, 2-3 times a day. Do this for the first 2 days after the injury.  Apply compression by wrapping the injured area with an elastic bandage as told by your health care provider.  Raise (elevate) the injured area above the level of your heart while you are sitting or lying down. General instructions  Take over-the-counter and prescription medicines only as told by your health care provider.  Walk, stretch, and do exercises as told by your health care provider. Only do these activities if you can do so without any pain. How is this prevented?  Warm up  and stretch before being active.  Cool down and stretch after being active.  Give your body time to rest between periods of activity.  Make sure to use equipment that fits you.  Be safe and responsible while being active to avoid slips and falls.  Maintain physical fitness, including: ? Proper conditioning in the adductor muscles. ? Overall strength, flexibility, and endurance. Contact a  health care provider if:  You have increased pain or swelling in the affected area.  Your symptoms are not improving or they are getting worse. This information is not intended to replace advice given to you by your health care provider. Make sure you discuss any questions you have with your health care provider. Document Released: 05/04/2004 Document Revised: 03/26/2016 Document Reviewed: 06/10/2015 Elsevier Interactive Patient Education  2018 [employer].

## 2017-11-18 NOTE — Progress Notes (Signed)
Name: Wesley Vaughn   MRN: [id number]    DOB: 02-Apr-1976   Date:11/18/2017       Progress Note  Subjective  Chief Complaint  Chief Complaint  Patient presents with  . Groin Pain    hurts during sex, deep coughing and running- is still on r) side. Aggravating when sitting, but gets worse during the previous activities    Groin Pain  The patient's pertinent negatives include no genital injury, genital itching, genital lesions, pelvic pain, penile discharge, penile pain, priapism, scrotal swelling or testicular pain. This is a chronic problem. The current episode started more than 1 month ago (7 months). The problem occurs constantly. The problem has been waxing and waning. The pain is medium. Associated symptoms include painful intercourse. Pertinent negatives include no abdominal pain, anorexia, chest pain, chills, constipation, coughing, diarrhea, discolored urine, dysuria, fever, flank pain, frequency, headaches, hematuria, hesitancy, joint pain, joint swelling, nausea, rash, shortness of breath, sore throat, urgency, urinary retention or vomiting. The symptoms are aggravated by intercourse, heavy lifting and activity (no pain while running/ but noted afterwards). He has tried nothing for the symptoms. He is sexually active. He never uses condoms. There is no history of cryptorchidism, erectile dysfunction or an inguinal hernia. (Eval surgery)    No problem-specific Assessment & Plan notes found for this encounter.   Past Medical History:  Diagnosis Date  . Asthma with allergic rhinitis     Past Surgical History:  Procedure Laterality Date  . KNEE ARTHROSCOPY W/ MENISCAL REPAIR Left     Family History  Problem Relation Age of Onset  . Hyperlipidemia Mother   . Diabetes Mother   . Diabetes Father   . Hyperlipidemia Father   . Depression Father   . Stroke Father   . Hypertension Father     Social History   Socioeconomic History  . Marital status: Married    Spouse  name: Not on file  . Number of children: Not on file  . Years of education: Not on file  . Highest education level: Not on file  Social Needs  . Financial resource strain: Not on file  . Food insecurity - worry: Not on file  . Food insecurity - inability: Not on file  . Transportation needs - medical: Not on file  . Transportation needs - non-medical: Not on file  Occupational History  . Not on file  Tobacco Use  . Smoking status: Never Smoker  . Smokeless tobacco: Current User    Types: Snuff  Substance and Sexual Activity  . Alcohol use: Yes    Alcohol/week: 0.0 oz  . Drug use: No  . Sexual activity: Yes  Other Topics Concern  . Not on file  Social History Narrative  . Not on file    No Known Allergies  Outpatient Medications Prior to Visit  Medication Sig Dispense Refill  . fexofenadine-pseudoephedrine (ALLEGRA-D 24) 180-240 MG 24 hr tablet Take 1 tablet by mouth daily.    Marland Kitchen loratadine (CLARITIN) 10 MG tablet Take 10 mg by mouth daily as needed for allergies. Reported on 11/21/2015     No facility-administered medications prior to visit.     Review of Systems  Constitutional: Negative for chills, fever, malaise/fatigue and weight loss.  HENT: Negative for ear discharge, ear pain and sore throat.   Eyes: Negative for blurred vision.  Respiratory: Negative for cough, sputum production, shortness of breath and wheezing.   Cardiovascular: Negative for chest pain, palpitations and leg swelling.  Gastrointestinal:  Negative for abdominal pain, anorexia, blood in stool, constipation, diarrhea, heartburn, melena, nausea and vomiting.  Genitourinary: Negative for discharge, dysuria, flank pain, frequency, hematuria, hesitancy, pelvic pain, penile pain, scrotal swelling, testicular pain and urgency.  Musculoskeletal: Negative for back pain, joint pain, myalgias and neck pain.  Skin: Negative for rash.  Neurological: Negative for dizziness, tingling, sensory change, focal weakness  and headaches.  Endo/Heme/Allergies: Negative for environmental allergies and polydipsia. Does not bruise/bleed easily.  Psychiatric/Behavioral: Negative for depression and suicidal ideas. The patient is not nervous/anxious and does not have insomnia.      Objective  Vitals:   11/18/17 0837  BP: 120/70  Pulse: 60  Weight: 172 lb (78 kg)  Height: 5' 7"  (1.702 m)    Physical Exam  Constitutional: He is oriented to person, place, and time and well-developed, well-nourished, and in no distress.  HENT:  Head: Normocephalic.  Right Ear: External ear normal.  Left Ear: External ear normal.  Nose: Nose normal.  Mouth/Throat: Oropharynx is clear and moist.  Eyes: Conjunctivae and EOM are normal. Pupils are equal, round, and reactive to light. Right eye exhibits no discharge. Left eye exhibits no discharge. No scleral icterus.  Neck: Normal range of motion. Neck supple. No JVD present. No tracheal deviation present. No thyromegaly present.  Cardiovascular: Normal rate, regular rhythm, normal heart sounds and intact distal pulses. Exam reveals no gallop and no friction rub.  No murmur heard. Pulmonary/Chest: Breath sounds normal. No respiratory distress. He has no wheezes. He has no rales.  Abdominal: Soft. Bowel sounds are normal. He exhibits no mass. There is no hepatosplenomegaly. There is no tenderness. There is no rebound, no guarding and no CVA tenderness.  Musculoskeletal: Normal range of motion. He exhibits no edema.       Right hip: He exhibits tenderness. He exhibits normal range of motion, normal strength, no bony tenderness, no swelling, no crepitus and no deformity.  Lymphadenopathy:    He has no cervical adenopathy.       Right: No inguinal adenopathy present.  Neurological: He is alert and oriented to person, place, and time. He has normal sensation, normal strength, normal reflexes and intact cranial nerves. No cranial nerve deficit.  Skin: Skin is warm. No rash noted.   Psychiatric: Mood and affect normal.  Nursing note and vitals reviewed.     Assessment & Plan  Problem List Items Addressed This Visit    None    Visit Diagnoses    Ilio-inguinal strain, sequela    -  Primary   Relevant Medications   meloxicam (MOBIC) 15 MG tablet   Other Relevant Orders   Ambulatory referral to Physical Therapy   Influenza vaccine needed       Relevant Orders   Flu Vaccine QUAD 36+ mos IM (Completed)      Meds ordered this encounter  Medications  . meloxicam (MOBIC) 15 MG tablet    Sig: Take 1 tablet (15 mg total) by mouth daily.    Dispense:  30 tablet    Refill:  0      Dr. Deanna Jones [facility]  11/18/17

## 2018-07-21 ENCOUNTER — Ambulatory Visit: Payer: BC Managed Care – PPO | Admitting: Family Medicine

## 2018-07-24 ENCOUNTER — Ambulatory Visit: Payer: BC Managed Care – PPO | Admitting: Family Medicine

## 2018-07-24 ENCOUNTER — Encounter: Payer: Self-pay | Admitting: Family Medicine

## 2018-07-24 VITALS — BP 130/80 | HR 68 | Ht 67.0 in | Wt 178.0 lb

## 2018-07-24 DIAGNOSIS — J342 Deviated nasal septum: Secondary | ICD-10-CM | POA: Diagnosis not present

## 2018-07-24 DIAGNOSIS — R400 Somnolence: Secondary | ICD-10-CM

## 2018-07-24 DIAGNOSIS — F321 Major depressive disorder, single episode, moderate: Secondary | ICD-10-CM

## 2018-07-24 MED ORDER — BUPROPION HCL ER (XL) 150 MG PO TB24: 150.0000 mg | ORAL_TABLET | Freq: Every day | ORAL | 2 refills | Status: DC

## 2018-07-24 NOTE — Progress Notes (Signed)
Date:  07/24/2018   Name:  Wesley Vaughn   DOB:  1976-04-11   MRN:  [id number]   Chief Complaint: Depression (PHQ9=15. Has improved alot since being on Buproprion x 2 weeks, but still having crying spells.) and Snoring (hasn't had any episodes of apnea) Depression         This is a new problem.  The current episode started more than 1 month ago.   The onset quality is gradual.   The problem occurs daily.  The problem has been waxing and waning since onset.  Associated symptoms include decreased concentration, fatigue, helplessness, hopelessness, irritable, restlessness, decreased interest, sad and suicidal ideas.  Associated symptoms include no myalgias and no headaches.( Suicidal thoughts not planning.)     The symptoms are aggravated by work stress.  Past treatments include other medications (Welbutrin xl 167m ).  Compliance with treatment is good.    Review of Systems  Constitutional: Positive for fatigue. Negative for chills and fever.  HENT: Negative for drooling, ear discharge, ear pain and sore throat.   Respiratory: Negative for cough, shortness of breath and wheezing.   Cardiovascular: Negative for chest pain, palpitations and leg swelling.  Gastrointestinal: Negative for abdominal pain, blood in stool, constipation, diarrhea and nausea.  Endocrine: Negative for polydipsia.  Genitourinary: Negative for dysuria, frequency, hematuria and urgency.  Musculoskeletal: Negative for back pain, myalgias and neck pain.  Skin: Negative for rash.  Allergic/Immunologic: Negative for environmental allergies.  Neurological: Negative for dizziness and headaches.  Hematological: Does not bruise/bleed easily.  Psychiatric/Behavioral: Positive for decreased concentration, depression and suicidal ideas. The patient is not nervous/anxious.     Patient Active Problem List   Diagnosis Date Noted  . Allergic rhinitis 05/13/2017  . Exercise-induced asthma 05/13/2017  . Cavitary pneumonia  05/03/2016  . [place] 12/[place]2015  . [place] 12/[place]/2015  . 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 Recurrent pneumonia 11/04/2015  . Chest pain, unspecified 10/22/2015  . Disequilibrium 10/22/2015  . Acute medial meniscus tear of left knee 07/23/2014    No Known Allergies  Past Surgical History:  Procedure Laterality Date  . KNEE ARTHROSCOPY W/ MENISCAL REPAIR Left     Social History   Tobacco Use  . Smoking status: Never Smoker  . Smokeless tobacco: Former USystems developer   Types: Snuff  Substance Use Topics  . Alcohol use: Yes    Alcohol/week: 0.0 standard drinks  . Drug use: No     Medication list has been reviewed and updated.  Current Meds  Medication Sig  . buPROPion (WELLBUTRIN XL) 150 MG 24 hr tablet Take 150 mg by mouth daily.  . fexofenadine (ALLEGRA) 60 MG tablet Take 60 mg by mouth 2 (two) times daily.  . [DISCONTINUED] fexofenadine-pseudoephedrine (ALLEGRA-D 24) 180-240 MG 24 hr tablet Take 1 tablet by mouth daily.    PHQ 2/9 Scores 07/24/2018 05/12/2017 10/28/2015  PHQ - 2 Score 3 0 0  PHQ- 9 Score 15 0 -    Physical Exam  Constitutional: He is oriented to person, place, and time. He is irritable.  HENT:  Head: Normocephalic.  Right Ear: External ear normal.  Left Ear: External ear normal.  Nose: Septal deviation present.  Mouth/Throat: Oropharynx is clear and moist.  Eyes: Pupils are equal, round, and reactive to light. Conjunctivae and EOM are normal. Right eye exhibits no discharge. Left eye exhibits no discharge. No scleral icterus.  Neck: Normal range of motion.  Neck supple. No JVD present. No tracheal deviation present. No thyromegaly present.  Cardiovascular: Normal rate, regular rhythm, normal heart sounds and intact distal pulses. Exam reveals no gallop and no friction rub.  No murmur heard. Pulmonary/Chest: Breath sounds normal. No respiratory distress. He has no wheezes. He has no rales.    Abdominal: Soft. Bowel sounds are normal. He exhibits no mass. There is no hepatosplenomegaly. There is no tenderness. There is no rebound, no guarding and no CVA tenderness.  Musculoskeletal: Normal range of motion. He exhibits no edema or tenderness.  Lymphadenopathy:    He has no cervical adenopathy.  Neurological: He is alert and oriented to person, place, and time. He has normal strength and normal reflexes. No cranial nerve deficit.  Skin: Skin is warm. No rash noted.  Psychiatric: He has a normal mood and affect. His behavior is normal.  Nursing note and vitals reviewed.   BP 130/80   Pulse 68   Ht 5' 7"  (1.702 m)   Wt 178 lb (80.7 kg)   BMI 27.88 kg/m   Assessment and Plan:  There are no diagnoses linked to this encounter.   Dr. [facility]  07/24/2018

## 2018-10-07 ENCOUNTER — Other Ambulatory Visit: Payer: Self-pay | Admitting: Family Medicine

## 2018-12-07 ENCOUNTER — Other Ambulatory Visit: Payer: Self-pay | Admitting: Family Medicine

## 2018-12-08 ENCOUNTER — Ambulatory Visit: Payer: BC Managed Care – PPO | Admitting: Family Medicine

## 2018-12-08 ENCOUNTER — Other Ambulatory Visit: Payer: Self-pay

## 2018-12-08 ENCOUNTER — Encounter: Payer: Self-pay | Admitting: Family Medicine

## 2018-12-08 VITALS — BP 120/88 | HR 76 | Ht 67.0 in | Wt 183.0 lb

## 2018-12-08 DIAGNOSIS — F321 Major depressive disorder, single episode, moderate: Secondary | ICD-10-CM | POA: Diagnosis not present

## 2018-12-08 MED ORDER — BUPROPION HCL ER (XL) 300 MG PO TB24: 300.0000 mg | ORAL_TABLET | Freq: Every day | ORAL | 1 refills | Status: DC

## 2018-12-08 NOTE — Progress Notes (Signed)
Date:  12/08/2018   Name:  Wesley Vaughn   DOB:  07/10/1976   MRN:  [id number]   Chief Complaint: Depression (PHQ9=2 Can he increase on the dose of med?)  Depression       The patient presents with depression.  This is a chronic problem.  The current episode started more than 1 year ago.   The onset quality is gradual.   The problem occurs daily.  The problem has been waxing and waning since onset.  Associated symptoms include fatigue and insomnia.  Associated symptoms include no myalgias, no headaches and no suicidal ideas.     The symptoms are aggravated by family issues.  Past treatments include other medications (bupropion).  Compliance with treatment is good.  Previous treatment provided mild relief.  Past medical history includes depression.     Review of Systems  Constitutional: Positive for fatigue. Negative for chills and fever.  HENT: Negative for drooling, ear discharge, ear pain and sore throat.   Respiratory: Negative for cough, shortness of breath and wheezing.   Cardiovascular: Negative for chest pain, palpitations and leg swelling.  Gastrointestinal: Negative for abdominal pain, blood in stool, constipation, diarrhea and nausea.  Endocrine: Negative for polydipsia.  Genitourinary: Negative for dysuria, frequency, hematuria and urgency.  Musculoskeletal: Negative for back pain, myalgias and neck pain.  Skin: Negative for rash.  Allergic/Immunologic: Negative for environmental allergies.  Neurological: Negative for dizziness and headaches.  Hematological: Does not bruise/bleed easily.  Psychiatric/Behavioral: Positive for depression. Negative for suicidal ideas. The patient has insomnia. The patient is not nervous/anxious.     Patient Active Problem List   Diagnosis Date Noted  . Allergic rhinitis 05/13/2017  . Exercise-induced asthma 05/13/2017  . Cavitary pneumonia 05/03/2016  . Abscess of upper lobe of left lung with pneumonia ([place]) 12/01/2015  . MRSA  pneumonia ([place]) 12/01/2015  . 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 Recurrent pneumonia 11/04/2015  . Chest pain, unspecified 10/22/2015  . Disequilibrium 10/22/2015  . Acute medial meniscus tear of left knee 07/23/2014    No Known Allergies  Past Surgical History:  Procedure Laterality Date  . KNEE ARTHROSCOPY W/ MENISCAL REPAIR Left     Social History   Tobacco Use  . Smoking status: Never Smoker  . Smokeless tobacco: Former Systems developer    Types: Snuff  Substance Use Topics  . Alcohol use: Yes    Alcohol/week: 0.0 standard drinks  . Drug use: No     Medication list has been reviewed and updated.  Current Meds  Medication Sig  . buPROPion (WELLBUTRIN XL) 150 MG 24 hr tablet Take 150 mg by mouth daily.  Marland Kitchen triamcinolone (NASACORT ALLERGY 24HR) 55 MCG/ACT AERO nasal inhaler Place 2 sprays into the nose daily. ENT    PHQ 2/9 Scores 12/08/2018 07/24/2018 05/12/2017 10/28/2015  PHQ - 2 Score 1 3 0 0  PHQ- 9 Score 2 15 0 -    Physical Exam Vitals signs and nursing note reviewed.  HENT:     Head: Normocephalic.     Right Ear: Tympanic membrane, ear canal and external ear normal.     Left Ear: Tympanic membrane, ear canal and external ear normal.     Nose: Nose normal. No congestion or rhinorrhea.  Eyes:     General: No scleral icterus.       Right eye: No discharge.        Left eye: No discharge.     Conjunctiva/sclera: Conjunctivae normal.  Pupils: Pupils are equal, round, and reactive to light.  Neck:     Musculoskeletal: Normal range of motion and neck supple.     Thyroid: No thyromegaly.     Vascular: No JVD.     Trachea: No tracheal deviation.  Cardiovascular:     Rate and Rhythm: Normal rate and regular rhythm.     Heart sounds: Normal heart sounds. No murmur. No friction rub. No gallop.   Pulmonary:     Effort: No respiratory distress.     Breath sounds: Normal breath sounds. No wheezing or rales.  Abdominal:     General: Bowel sounds are normal.     Palpations:  Abdomen is soft. There is no mass.     Tenderness: There is no abdominal tenderness. There is no guarding or rebound.  Musculoskeletal: Normal range of motion.        General: No tenderness.  Lymphadenopathy:     Cervical: No cervical adenopathy.  Skin:    General: Skin is warm.     Findings: No rash.  Neurological:     Mental Status: He is alert and oriented to person, place, and time.     Cranial Nerves: No cranial nerve deficit.     Deep Tendon Reflexes: Reflexes are normal and symmetric.     Wt Readings from Last 3 Encounters:  12/08/18 183 lb (83 kg)  07/24/18 178 lb (80.7 kg)  11/18/17 172 lb (78 kg)    BP 120/88   Pulse 76   Ht 5' 7"  (1.702 m)   Wt 183 lb (83 kg)   BMI 28.66 kg/m   Assessment and Plan:  1. Current moderate episode of major depressive disorder, unspecified whether recurrent (HCC) Chronic.  Persistent.  Patient has had some breakthrough depression infested by fatigue/insomnia.  Increase Wellbutrin to 300 mg XL once a day.  Will recheck in 6 months. - buPROPion (WELLBUTRIN XL) 300 MG 24 hr tablet; Take 1 tablet (300 mg total) by mouth daily.  Dispense: 90 tablet; Refill: 1

## 2018-12-31 ENCOUNTER — Other Ambulatory Visit: Payer: Self-pay | Admitting: Family Medicine

## 2019-04-05 ENCOUNTER — Encounter: Payer: Self-pay | Admitting: Family Medicine

## 2019-04-10 ENCOUNTER — Ambulatory Visit
Admission: RE | Admit: 2019-04-10 | Discharge: 2019-04-10 | Disposition: A | Discharge status: Home / Self Care | Payer: BC Managed Care – PPO | Attending: Family Medicine | Admitting: Family Medicine

## 2019-04-10 ENCOUNTER — Ambulatory Visit: Payer: BC Managed Care – PPO | Admitting: Family Medicine

## 2019-04-10 ENCOUNTER — Ambulatory Visit
Admission: RE | Admit: 2019-04-10 | Discharge: 2019-04-10 | Disposition: A | Discharge status: Home / Self Care | Payer: BC Managed Care – PPO | Source: Ambulatory Visit | Attending: Family Medicine | Admitting: Family Medicine

## 2019-04-10 ENCOUNTER — Other Ambulatory Visit: Payer: Self-pay

## 2019-04-10 ENCOUNTER — Encounter: Payer: Self-pay | Admitting: Family Medicine

## 2019-04-10 VITALS — BP 120/80 | HR 80 | Ht 67.0 in | Wt 186.0 lb

## 2019-04-10 DIAGNOSIS — R0789 Other chest pain: Secondary | ICD-10-CM

## 2019-04-10 DIAGNOSIS — R9389 Abnormal findings on diagnostic imaging of other specified body structures: Secondary | ICD-10-CM

## 2019-04-10 DIAGNOSIS — Z8701 Personal history of pneumonia (recurrent): Secondary | ICD-10-CM | POA: Insufficient documentation

## 2019-04-10 DIAGNOSIS — F321 Major depressive disorder, single episode, moderate: Secondary | ICD-10-CM

## 2019-04-10 DIAGNOSIS — J4520 Mild intermittent asthma, uncomplicated: Secondary | ICD-10-CM | POA: Diagnosis not present

## 2019-04-10 MED ORDER — BUPROPION HCL ER (XL) 300 MG PO TB24: 300.0000 mg | ORAL_TABLET | Freq: Every day | ORAL | 1 refills | Status: AC

## 2019-04-10 MED ORDER — ALBUTEROL SULFATE HFA 108 (90 BASE) MCG/ACT IN AERS: 2.0000 | INHALATION_SPRAY | Freq: Four times a day (QID) | RESPIRATORY_TRACT | 1 refills | Status: AC | PRN

## 2019-04-10 NOTE — Progress Notes (Signed)
Date:  04/10/2019   Name:  Wesley Vaughn   DOB:  Apr 23, 1976   MRN:  [id number]   Chief Complaint: history pneumonia (no follow up with Dr Fitzgerald/ needs chest xray and rx for albuterol inhaler)  Pneumonia There is no chest tightness, cough, difficulty breathing, frequent throat clearing, hemoptysis, hoarse voice, shortness of breath, sputum production or wheezing. Chronicity: 2017. The current episode started more than 1 year ago. The problem has been gradually improving. Pertinent negatives include no appetite change, chest pain, dyspnea on exertion, ear congestion, ear pain, fever, headaches, heartburn, malaise/fatigue, myalgias, nasal congestion, orthopnea, PND, postnasal drip, rhinorrhea, sneezing, sore throat, sweats, trouble swallowing or weight loss. His symptoms are alleviated by beta-agonist. He reports minimal improvement on treatment. His symptoms are not alleviated by beta-agonist. There are no known risk factors for lung disease. There is no history of asthma, bronchiectasis, bronchitis, COPD, emphysema or pneumonia.  Chest Pain  This is a recurrent problem. The current episode started in the past 7 days (last episode). The onset quality is gradual. The problem occurs rarely. The problem has been waxing and waning. The pain is present in the lateral region. The pain is mild. Quality: colicky. The pain does not radiate. Associated symptoms include back pain. Pertinent negatives include no abdominal pain, cough, dizziness, fever, headaches, hemoptysis, malaise/fatigue, nausea, orthopnea, palpitations, PND, shortness of breath or sputum production.    Review of Systems  Constitutional: Negative for appetite change, chills, fever, malaise/fatigue and weight loss.  HENT: Negative for drooling, ear discharge, ear pain, hoarse voice, postnasal drip, rhinorrhea, sneezing, sore throat and trouble swallowing.   Respiratory: Negative for cough, hemoptysis, sputum production, chest  tightness, shortness of breath and wheezing.   Cardiovascular: Negative for chest pain, dyspnea on exertion, palpitations, orthopnea, leg swelling and PND.  Gastrointestinal: Negative for abdominal pain, blood in stool, constipation, diarrhea, heartburn and nausea.  Endocrine: Negative for polydipsia.  Genitourinary: Negative for dysuria, frequency, hematuria and urgency.  Musculoskeletal: Positive for back pain. Negative for myalgias and neck pain.  Skin: Negative for rash.  Allergic/Immunologic: Negative for environmental allergies.  Neurological: Negative for dizziness and headaches.  Hematological: Does not bruise/bleed easily.  Psychiatric/Behavioral: Negative for suicidal ideas. The patient is not nervous/anxious.     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  Current Meds  Medication Sig   buPROPion (WELLBUTRIN XL) 300 MG 24 hr tablet Take 1 tablet (300 mg total) by mouth daily.   triamcinolone (NASACORT ALLERGY 24HR) 55 MCG/ACT AERO nasal inhaler Place 2 sprays into the nose daily. ENT   [  DISCONTINUED]  buPROPion (WELLBUTRIN XL) 150 MG 24 hr tablet TAKE 1 TABLET BY MOUTH EVERY DAY    PHQ 2/9 Scores 04/10/2019 12/08/2018 07/24/2018 05/12/2017  PHQ - 2 Score 0 1 3 0  PHQ- 9 Score 0 2 15 0    BP Readings from Last 3 Encounters:  04/10/19 120/80  12/08/18 120/88  07/24/18 130/80    Physical Exam Vitals signs and nursing note reviewed.  HENT:     Head: Normocephalic.     Right Ear: Tympanic membrane, ear canal and external ear normal.     Left Ear: Tympanic membrane, ear canal and external ear normal.     Nose: Nose normal. No congestion or rhinorrhea.     Mouth/Throat:     Mouth: Mucous membranes are moist.  Eyes:     General: No scleral icterus.       Right eye: No discharge.        Left eye: No discharge.     Conjunctiva/sclera: Conjunctivae normal.     Pupils: Pupils are equal, round, and reactive to light.  Neck:     Musculoskeletal: Normal range of motion and neck supple.     Thyroid: No thyromegaly.     Vascular: No JVD.     Trachea: No tracheal deviation.  Cardiovascular:     Rate and Rhythm: Normal rate and regular rhythm.     Heart sounds: Normal heart sounds. No murmur. No friction rub. No gallop.   Pulmonary:     Effort: No respiratory distress.     Breath sounds: Normal breath sounds. No wheezing, rhonchi or rales.  Abdominal:     General: Bowel sounds are normal.     Palpations: Abdomen is soft. There is no mass.     Tenderness: There is no abdominal tenderness. There is no guarding or rebound.  Musculoskeletal: Normal range of motion.        General: No tenderness.  Lymphadenopathy:     Cervical: No cervical adenopathy.  Skin:    General: Skin is warm.     Findings: No rash.  Neurological:     Mental Status: He is alert and oriented to person, place, and time.     Cranial Nerves: No cranial nerve deficit.     Deep Tendon Reflexes: Reflexes are normal and symmetric.     Wt Readings from Last 3 Encounters:  04/10/19 186 lb (84.4 kg)  12/08/18 183 lb  (83 kg)  07/24/18 178 lb (80.7 kg)    BP 120/80    Pulse 80    Ht 5' 7"  (1.702 m)    Wt 186 lb (84.4 kg)    BMI 29.13 kg/m   Assessment and Plan:  1. Abnormal chest x-ray On review of when patient had his staphylococcal pneumonia in 2017 it was noted that he did not return for follow-up.  The chest x-ray/possible chest CT/pulmonary consult.  Patient was undergoing a lot of concerning stress circumstances.  Patient was called to come in to recheck.  Breath sounds were noted in all areas and we will check a PA and lateral chest x-ray to determine if there is any residual from the pneumonia of concern. - DG Chest 2 View; Future  2. 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  We will recheck a chest x-ray for baseline. - DG Chest 2 View; Future  3. Mild intermittent asthma, unspecified whether complicated Patient has mild intermittent asthma at times associated with  exercise will refill albuterol inhaler 2 puffs every 6 hours as needed for shortness of breath. - albuterol (VENTOLIN HFA) 108 (90 Base) MCG/ACT inhaler; Inhale 2 puffs into the lungs every 6 (six) hours as needed for wheezing or shortness of breath.  Dispense: 6.7 g; Refill: 1  4. Current moderate episode of major depressive disorder, unspecified whether recurrent ([place]) Patient is controlled.  Patient would like to continue Wellbutrin which is helped him a lot in terms of his disposition and we will refill his Wellbutrin XL 300 mg once a day daily. - buPROPion (WELLBUTRIN XL) 300 MG 24 hr tablet; Take 1 tablet (300 mg total) by mouth daily.  Dispense: 90 tablet; Refill: 1  5. Atypical chest pain Patient had a recent episode of atypical chest pain in the left lateral aspect there is clear breath sounds in all areas we will do a chest x-ray to make sure that there is nothing of a pleural based concern.

## 2019-05-17 ENCOUNTER — Encounter: Payer: Self-pay | Admitting: Family Medicine

## 2019-05-17 ENCOUNTER — Ambulatory Visit: Payer: BC Managed Care – PPO | Admitting: Family Medicine

## 2019-05-17 ENCOUNTER — Other Ambulatory Visit: Payer: Self-pay

## 2019-05-17 VITALS — BP 130/98 | HR 64 | Ht 67.0 in | Wt 192.0 lb

## 2019-05-17 DIAGNOSIS — M5126 Other intervertebral disc displacement, lumbar region: Secondary | ICD-10-CM

## 2019-05-17 MED ORDER — TRAMADOL HCL 50 MG PO TABS: 50.0000 mg | ORAL_TABLET | Freq: Four times a day (QID) | ORAL | 0 refills | Status: AC | PRN

## 2019-05-17 MED ORDER — CYCLOBENZAPRINE HCL 10 MG PO TABS: 10.0000 mg | ORAL_TABLET | Freq: Three times a day (TID) | ORAL | 0 refills | Status: AC | PRN

## 2019-05-17 MED ORDER — PREDNISONE 10 MG PO TABS: 10.0000 mg | ORAL_TABLET | Freq: Every day | ORAL | 0 refills | Status: AC

## 2019-05-17 NOTE — Patient Instructions (Signed)
Herniated Disk  A herniated disk, also called a ruptured disk or slipped disk, occurs when a disk in the spine bulges out too far. Between the bones in the spine (vertebrae), there are oval disks that are made of a soft, spongy center that is surrounded by a tough outer ring. The disks connect your vertebrae, help your spine move, and absorb shocks from your movement. When you have a herniated disk, the spongy center of the disk bulges out or breaks through the outer ring. It can press on a nerve between the vertebrae and cause pain. This can occur anywhere in the back or neck area, but the lower back is most commonly affected. What are the causes? This condition may be caused by:  Age-related wear and tear. The spongy centers of spinal disks tend to shrink and dry out with age, which makes them more likely to herniate.  Sudden injury, such as a strain or sprain. What increases the risk? Aging is the main risk factor for a herniated disk. Other risk factors include:  Being a man who is 35-51 years old.  Frequently doing activities that involve heavy lifting, bending, or twisting.  Frequently driving for long hours at a time.  Not getting enough exercise.  Being overweight.  Smoking.  Having a family history of back problems or herniated disks.  Being pregnant or giving birth.  Having poor nutrition.  Being tall. What are the signs or symptoms? Symptoms may vary depending on where your herniated disk is located.  A herniated disk in the lower back may cause sharp pain in: ? Part of the arm, leg, hip, or buttocks. ? The back of the lower leg (calf). ? The lower back, spreading down through the leg into the foot (sciatica).  A herniated disk in the neck may cause dizziness and vertigo. It may also cause pain or weakness in: ? The neck. ? The shoulder blades. ? Upper arm, forearm, or fingers.  You may also have muscle weakness. It may be difficult to: ? Lift your leg or arm.  ? Stand on your toes. ? Squeeze tightly with one of your hands.  Other symptoms may include: ? Numbness or tingling in the affected areas of the hands, arms, feet, or legs. ? Inability to control when you urinate or when you have bowel movements. This is a rare but serious sign of a severe herniated disk in the lower back. How is this diagnosed? This condition may be diagnosed based on:  Your symptoms.  Your medical history.  A physical exam. The exam may include: ? Straight-leg test. You will lie on your back while your health care provider lifts your leg, keeping your knee straight. If you feel pain, you likely have a herniated disk. ? Neurological tests. This includes checking for numbness, reflexes, muscle strength, and posture.  Imaging tests, such as: ? X-rays. ? MRI. ? CT scan. ? Electromyogram (EMG) to check the nerves that control muscles. This test may be used to determine which nerves are affected by your herniated disk. How is this treated? Treatment for this condition may include:  A short period of rest. This is usually the first treatment. ? You may be on bed rest for up to 2 days, or you may be instructed to stay home and avoid physical activity. ? If you have a herniated disk in your lower back, avoid sitting as much as possible. Sitting increases pressure on the disk.  Medicines. These may include: ? NSAIDs  to help reduce pain and swelling. ? Muscle relaxants to prevent sudden tightening of the back muscles (back spasms). ? Prescription pain medicines, if you have severe pain.  Steroid injections in the area of the herniated disk. This can help reduce pain and swelling.  Physical therapy to strengthen your back muscles. In many cases, symptoms go away with treatment over a period of days or weeks. You will most likely be free of symptoms after 3-4 months. If other treatments do not help to relieve your symptoms, you may need surgery. Follow these instructions  at home: Medicines  Take over-the-counter and prescription medicines only as told by your health care provider.  Do not drive or use heavy machinery while taking prescription pain medicine. Activity  Rest as directed.  After your rest period: ? Return to your normal activities and gradually begin exercising as told by your health care provider. Ask your health care provider what activities and exercises are safe for you. ? Use good posture. ? Avoid movements that cause pain. ? Do not lift anything that is heavier than 10 lb (4.5 kg) until your health care provider says this is safe. ? Do not sit or stand for long periods of time without changing positions. ? Do not sit for long periods of time without getting up and moving around.  If physical therapy was prescribed, do exercises as instructed.  Aim to strengthen muscles in your back and abdomen with exercises like crunches, swimming, or walking. General instructions  Do not use any products that contain nicotine or tobacco, such as cigarettes and e-cigarettes. These products can delay healing. If you need help quitting, ask your health care provider.  Do not wear high-heeled shoes.  Do not sleep on your belly.  If you are overweight, work with your health care provider to lose weight safely.  To prevent or treat constipation while you are taking prescription pain medicine, your health care provider may recommend that you: ? Drink enough fluid to keep your urine clear or pale yellow. ? Take over-the-counter or prescription medicines. ? Eat foods that are high in fiber, such as fresh fruits and vegetables, whole grains, and beans. ? Limit foods that are high in fat and processed sugars, such as fried and sweet foods.  Keep all follow-up visits as told by your health care provider. This is important. How is this prevented?   Maintain a healthy weight.  Try to avoid stressful situations.  Maintain physical fitness. Do at  least 150 minutes of moderate-intensity exercise each week, such as brisk walking or water aerobics.  When lifting objects: ? Keep your feet at least shoulder-width apart and tighten your abdominal muscles. ? Keep your spine neutral as you bend your knees and hips. It is important to lift using the strength of your legs, not your back. Do not lock your knees straight out. ? Always ask for help to lift heavy or awkward objects. Contact a health care provider if:  You have back pain or neck pain that does not get better after 6 weeks.  You have severe pain in your back, neck, legs, or arms.  You develop numbness, tingling, or weakness in any part of your body. Get help right away if:  You cannot move your arms or legs.  You cannot control when you urinate or have bowel movements.  You feel dizzy or you faint.  You have shortness of breath. This information is not intended to replace advice given to you by  your health care provider. Make sure you discuss any questions you have with your health care provider. Document Released: 09/03/2000 Document Revised: 08/19/2017 Document Reviewed: 05/03/2016 Elsevier Patient Education  2020 [employer].

## 2019-05-17 NOTE — Progress Notes (Signed)
Date:  05/17/2019   Name:  Wesley Vaughn   DOB:  04-30-1976   MRN:  [id number]   Chief Complaint: Back Pain (lower mid pain- hurts to start walking and when steps with L) foot. Started x 6 days ago while carrying baby. Taking Ibuprofen 838m TID- helps for about an hour to 2 and then starts hurting again)  Back Pain This is a recurrent problem. The current episode started in the past 7 days (Saturday). The problem occurs daily. The problem has been gradually improving since onset. The pain is present in the lumbar spine. The quality of the pain is described as aching. The pain radiates to the left knee. The pain is at a severity of 8/10. The pain is moderate. The symptoms are aggravated by bending and twisting (left leg elevation). Pertinent negatives include no abdominal pain, bladder incontinence, bowel incontinence, chest pain, dysuria, fever, headaches, leg pain, numbness, paresis, paresthesias, pelvic pain, perianal numbness, tingling, weakness or weight loss. He has tried NSAIDs for the symptoms. The treatment provided mild relief.    Review of Systems  Constitutional: Negative for chills, fever and weight loss.  HENT: Negative for drooling, ear discharge, ear pain and sore throat.   Respiratory: Negative for cough, shortness of breath and wheezing.   Cardiovascular: Negative for chest pain, palpitations and leg swelling.  Gastrointestinal: Negative for abdominal pain, blood in stool, bowel incontinence, constipation, diarrhea and nausea.  Endocrine: Negative for polydipsia.  Genitourinary: Negative for bladder incontinence, dysuria, frequency, hematuria, pelvic pain and urgency.  Musculoskeletal: Positive for back pain. Negative for myalgias and neck pain.  Skin: Negative for rash.  Allergic/Immunologic: Negative for environmental allergies.  Neurological: Negative for dizziness, tingling, weakness, numbness, headaches and paresthesias.  Hematological: Does not bruise/bleed  easily.  Psychiatric/Behavioral: Negative for suicidal ideas. The patient is not nervous/anxious.     Patient Active Problem List   Diagnosis Date Noted  . Current moderate episode of major depressive disorder ([facility] 12/08/2018  . Allergic rhinitis 05/13/2017  . Exercise-induced asthma 05/13/2017  . Cavitary pneumonia 05/03/2016  . Abscess of upper lobe of left lung with pneumonia ([facility] 12/01/2015  . MRSA pneumonia ([facility] 12/01/2015  . 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 Recurrent pneumonia 11/04/2015  . Chest pain, unspecified 10/22/2015  . Disequilibrium 10/22/2015  . Acute medial meniscus tear of left knee 07/23/2014    No Known Allergies  Past Surgical History:  Procedure Laterality Date  . KNEE ARTHROSCOPY W/ MENISCAL REPAIR Left     Social History   Tobacco Use  . Smoking status: Never Smoker  . Smokeless tobacco: Former [employer]   Types: Snuff  Substance Use Topics  . Alcohol use: Yes    Alcohol/week: 0.0 standard drinks  . Drug use: No     Medication list has been reviewed and updated.  Current Meds  Medication Sig  . albuterol (VENTOLIN HFA) 108 (90 Base) MCG/ACT inhaler Inhale 2 puffs into the lungs every 6 (six) hours as needed for wheezing or shortness of breath.  .Marland KitchenbuPROPion (WELLBUTRIN XL) 300 MG 24 hr tablet Take 1 tablet (300 mg total) by mouth daily.  .Marland Kitchentriamcinolone (NASACORT ALLERGY 24HR) 55 MCG/ACT AERO nasal inhaler Place 2 sprays into the nose daily. ENT    PHQ 2/9 Scores 05/17/2019 04/10/2019 12/08/2018 07/24/2018  PHQ - 2 Score 0 0 1 3  PHQ- 9 Score 0 0 2 15    BP Readings from Last 3 Encounters:  05/17/19 (!) 130/98  04/10/19  120/80  12/08/18 120/88    Physical Exam Vitals signs and nursing note reviewed.  HENT:     Head: Normocephalic.     Right Ear: Tympanic membrane, ear canal and external ear normal.     Left Ear: Tympanic membrane, ear canal and external ear normal.     Nose: Nose normal. No congestion or rhinorrhea.  Eyes:      General: No scleral icterus.       Right eye: No discharge.        Left eye: No discharge.     Conjunctiva/sclera: Conjunctivae normal.     Pupils: Pupils are equal, round, and reactive to light.  Neck:     Musculoskeletal: Normal range of motion and neck supple.     Thyroid: No thyromegaly.     Vascular: No JVD.     Trachea: No tracheal deviation.  Cardiovascular:     Rate and Rhythm: Normal rate and regular rhythm.     Heart sounds: Normal heart sounds. No murmur. No friction rub. No gallop.   Pulmonary:     Effort: No respiratory distress.     Breath sounds: Normal breath sounds. No wheezing or rales.  Abdominal:     General: Bowel sounds are normal.     Palpations: Abdomen is soft. There is no mass.     Tenderness: There is no abdominal tenderness. There is no guarding or rebound.  Musculoskeletal:        General: No tenderness.     Lumbar back: He exhibits decreased range of motion and spasm. He exhibits no tenderness, no bony tenderness and no swelling.     Comments: Positive SLR left  Lymphadenopathy:     Cervical: No cervical adenopathy.  Skin:    General: Skin is warm.     Findings: No rash.  Neurological:     Mental Status: He is alert and oriented to person, place, and time.     Cranial Nerves: No cranial nerve deficit.     Sensory: Sensation is intact.     Motor: Motor function is intact.     Deep Tendon Reflexes: Reflexes are normal and symmetric.     Reflex Scores:      Tricep reflexes are 2+ on the right side and 2+ on the left side.      Bicep reflexes are 2+ on the right side and 2+ on the left side.      Brachioradialis reflexes are 2+ on the right side and 2+ on the left side.      Patellar reflexes are 2+ on the right side and 2+ on the left side.      Achilles reflexes are 2+ on the right side and 2+ on the left side.    Wt Readings from Last 3 Encounters:  05/17/19 192 lb (87.1 kg)  04/10/19 186 lb (84.4 kg)  12/08/18 183 lb (83 kg)    BP (!)  130/98   Pulse 64   Ht 5' 7"  (1.702 m)   Wt 192 lb (87.1 kg)   BMI 30.07 kg/m   Assessment and Plan:  1. Herniated lumbar intervertebral disc Patient with recurrent lower back pain consistent with a herniated lumbar disc with positive straight leg raise on exam.  Will initiate prednisone 10 mg once a day.  We will also include cyclobenzaprine and tramadol for the more severe pain and spasm.  Patient will continue Advil or Aleve on an over-the-counter basis. - predniSONE (DELTASONE) 10 MG tablet; Take  1 tablet (10 mg total) by mouth daily with breakfast.  Dispense: 30 tablet; Refill: 0 - cyclobenzaprine (FLEXERIL) 10 MG tablet; Take 1 tablet (10 mg total) by mouth 3 (three) times daily as needed for muscle spasms.  Dispense: 30 tablet; Refill: 0 - traMADol (ULTRAM) 50 MG tablet; Take 1 tablet (50 mg total) by mouth every 6 (six) hours as needed for up to 5 days.  Dispense: 20 tablet; Refill: 0

## 2019-05-18 ENCOUNTER — Encounter: Payer: Self-pay | Admitting: Family Medicine

## 2019-05-18 ENCOUNTER — Ambulatory Visit: Payer: BC Managed Care – PPO | Admitting: Family Medicine

## 2019-06-09 ENCOUNTER — Other Ambulatory Visit: Payer: Self-pay | Admitting: Family Medicine

## 2019-06-09 DIAGNOSIS — M5126 Other intervertebral disc displacement, lumbar region: Secondary | ICD-10-CM

## 2020-07-31 IMAGING — CR CHEST - 2 VIEW
3 series · 3 of 3 positions shown · non-contrast
Comparison: November 03, 2015 x-ray.  November 04, 2015 CT scan.

CLINICAL DATA: Abnormal chest x-ray from 7445

EXAM:
CHEST - 2 VIEW

[chest pa (1 of 2)]
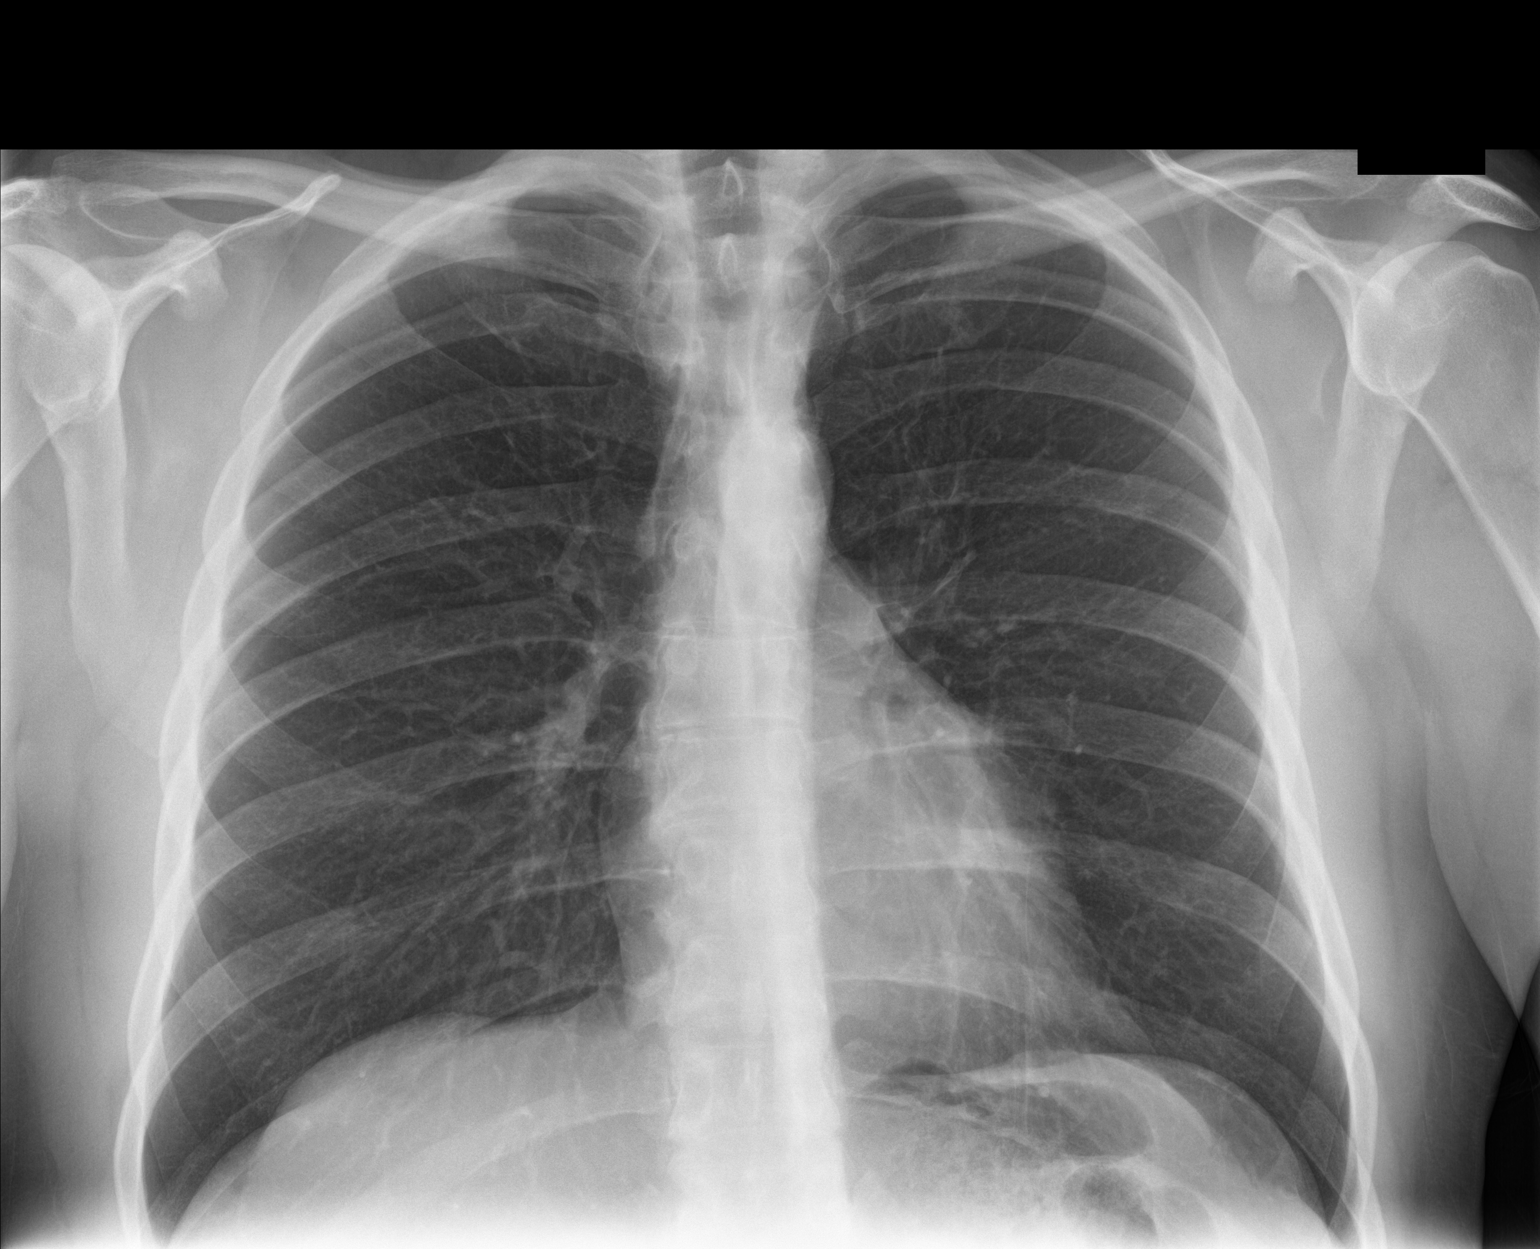

[chest lat]
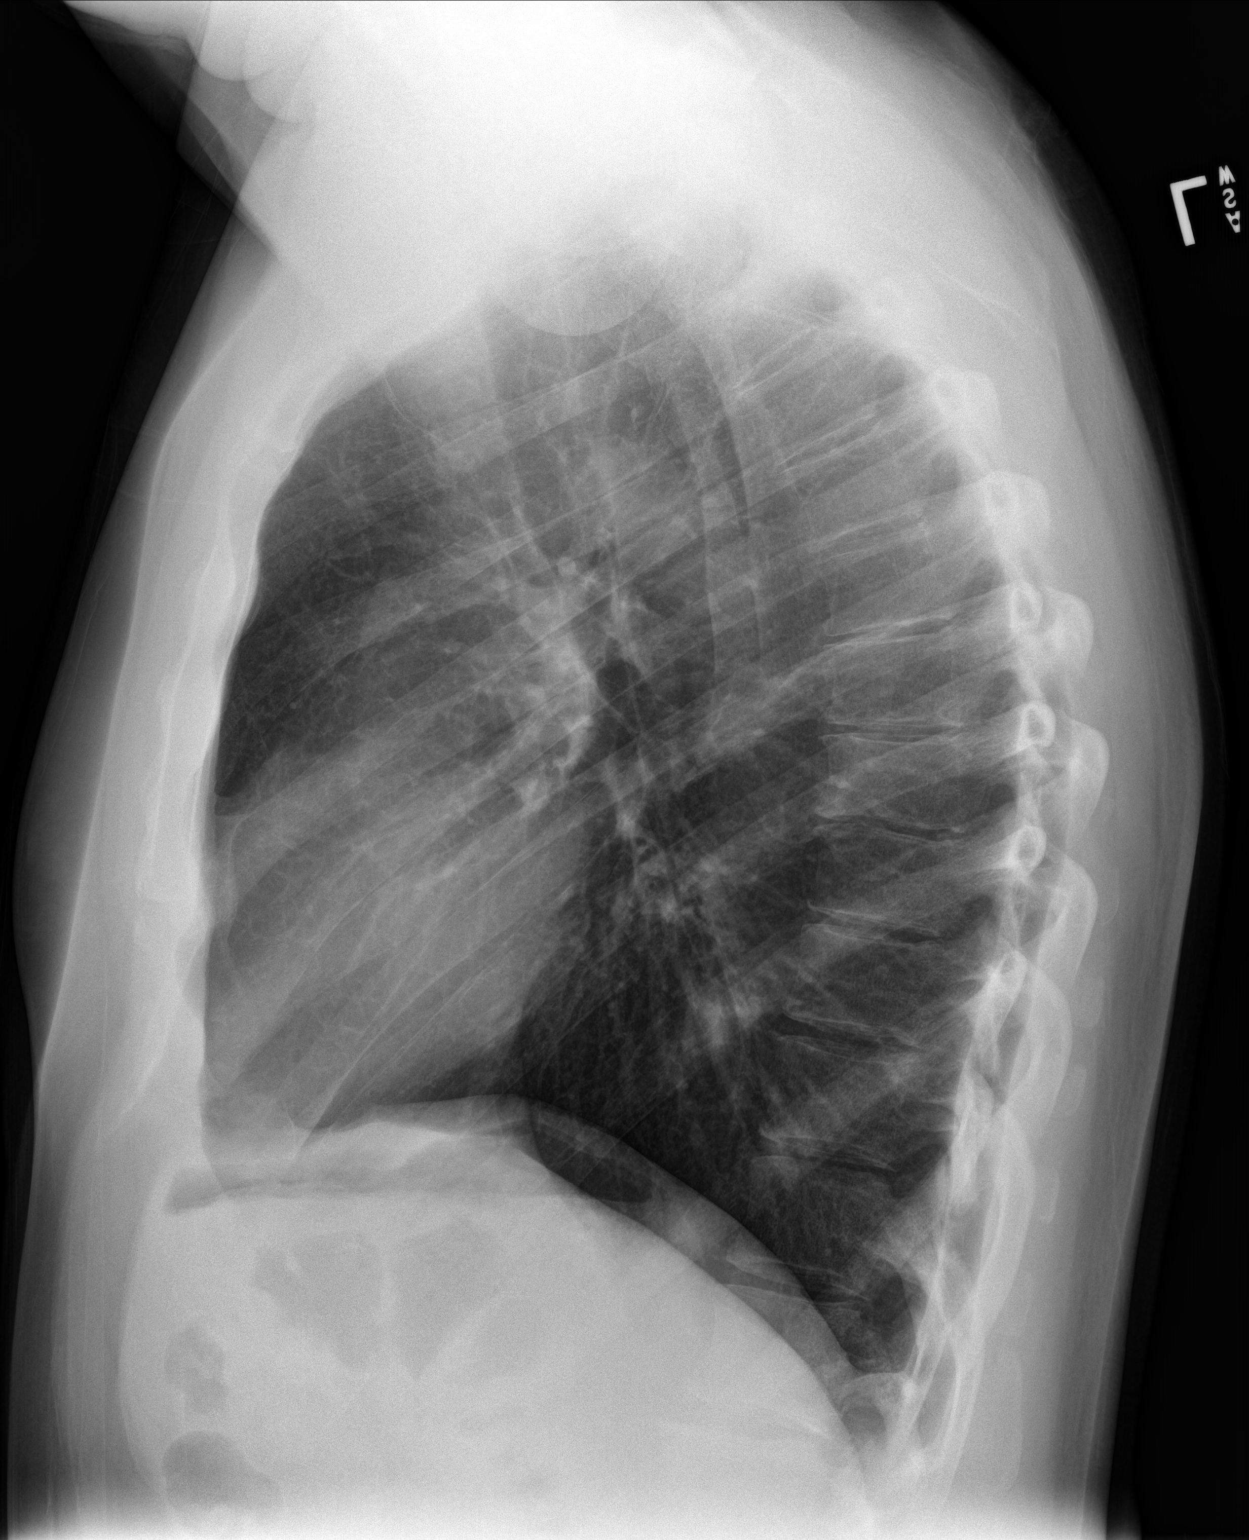

[chest pa (2 of 2)]
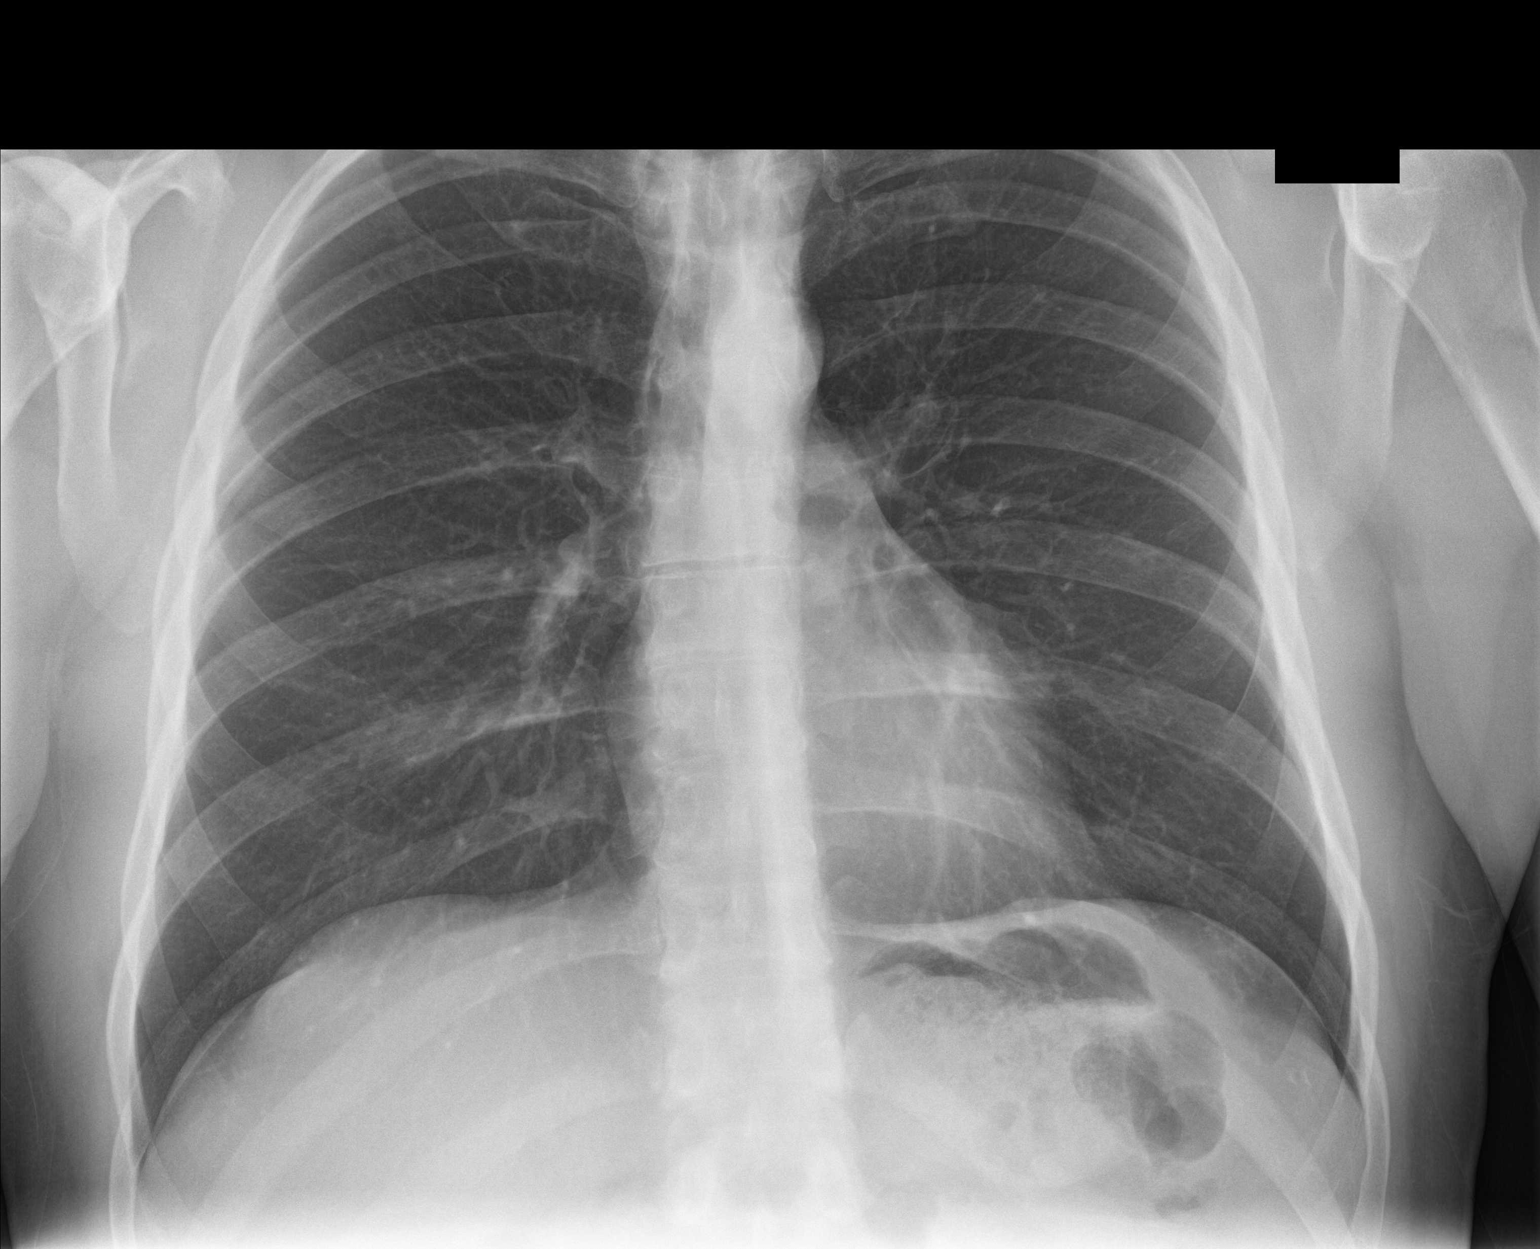

[3 of 3 positions shown; findings below may reference images not displayed]

FINDINGS: The infiltrate/pneumonia seen on the previous study from 7445 has
resolved. The heart, hila, mediastinum, lungs, and pleura are
otherwise normal.
IMPRESSION: No active cardiopulmonary disease.

## 2022-02-11 LAB — EXTERNAL GENERIC LAB PROCEDURE: COLOGUARD: NEGATIVE
